# Patient Record
Sex: Female | Born: 1935 | Race: White | Hispanic: No | Smoking: Former smoker
Health system: Southern US, Community
[De-identification: ages and names within clinical notes are randomized; demographics above are authoritative.]

## PROBLEM LIST (undated history)

## (undated) DIAGNOSIS — I7090 Unspecified atherosclerosis: Secondary | ICD-10-CM

## (undated) DIAGNOSIS — F329 Major depressive disorder, single episode, unspecified: Secondary | ICD-10-CM

## (undated) DIAGNOSIS — T7840XA Allergy, unspecified, initial encounter: Secondary | ICD-10-CM

## (undated) DIAGNOSIS — I639 Cerebral infarction, unspecified: Secondary | ICD-10-CM

## (undated) DIAGNOSIS — F32A Depression, unspecified: Secondary | ICD-10-CM

## (undated) DIAGNOSIS — E7800 Pure hypercholesterolemia, unspecified: Secondary | ICD-10-CM

## (undated) DIAGNOSIS — F0391 Unspecified dementia with behavioral disturbance: Secondary | ICD-10-CM

## (undated) DIAGNOSIS — E079 Disorder of thyroid, unspecified: Secondary | ICD-10-CM

## (undated) DIAGNOSIS — H269 Unspecified cataract: Secondary | ICD-10-CM

## (undated) DIAGNOSIS — M1990 Unspecified osteoarthritis, unspecified site: Secondary | ICD-10-CM

## (undated) DIAGNOSIS — F419 Anxiety disorder, unspecified: Secondary | ICD-10-CM

## (undated) DIAGNOSIS — F03918 Unspecified dementia, unspecified severity, with other behavioral disturbance: Secondary | ICD-10-CM

## (undated) HISTORY — DX: Unspecified osteoarthritis, unspecified site: M19.90

## (undated) HISTORY — PX: ABDOMINAL HYSTERECTOMY: SHX81

## (undated) HISTORY — DX: Major depressive disorder, single episode, unspecified: F32.9

## (undated) HISTORY — DX: Disorder of thyroid, unspecified: E07.9

## (undated) HISTORY — DX: Allergy, unspecified, initial encounter: T78.40XA

## (undated) HISTORY — DX: Depression, unspecified: F32.A

## (undated) HISTORY — DX: Anxiety disorder, unspecified: F41.9

## (undated) HISTORY — DX: Unspecified cataract: H26.9

## (undated) HISTORY — PX: EYE SURGERY: SHX253

## (undated) HISTORY — PX: KNEE SURGERY: SHX244

## (undated) HISTORY — PX: JOINT REPLACEMENT: SHX530

## (undated) HISTORY — DX: Cerebral infarction, unspecified: I63.9

---

## 1998-06-10 ENCOUNTER — Observation Stay (HOSPITAL_COMMUNITY): Admission: AD | Admit: 1998-06-10 | Discharge: 1998-06-11 | Payer: Self-pay | Admitting: Cardiology

## 1998-06-19 ENCOUNTER — Emergency Department (HOSPITAL_COMMUNITY): Admission: EM | Admit: 1998-06-19 | Discharge: 1998-06-19 | Payer: Self-pay | Admitting: Emergency Medicine

## 1998-06-19 ENCOUNTER — Encounter: Payer: Self-pay | Admitting: Emergency Medicine

## 1998-08-04 ENCOUNTER — Encounter: Payer: Self-pay | Admitting: Internal Medicine

## 1998-08-04 ENCOUNTER — Ambulatory Visit (HOSPITAL_COMMUNITY): Admission: RE | Admit: 1998-08-04 | Discharge: 1998-08-04 | Payer: Self-pay | Admitting: Internal Medicine

## 1999-10-23 ENCOUNTER — Emergency Department (HOSPITAL_COMMUNITY): Admission: EM | Admit: 1999-10-23 | Discharge: 1999-10-23 | Payer: Self-pay | Admitting: Emergency Medicine

## 2002-08-09 ENCOUNTER — Encounter: Admission: RE | Admit: 2002-08-09 | Discharge: 2002-08-09 | Payer: Self-pay | Admitting: Internal Medicine

## 2002-08-09 ENCOUNTER — Encounter: Payer: Self-pay | Admitting: Internal Medicine

## 2003-02-27 ENCOUNTER — Encounter: Payer: Self-pay | Admitting: Orthopedic Surgery

## 2003-03-03 ENCOUNTER — Inpatient Hospital Stay (HOSPITAL_COMMUNITY): Admission: RE | Admit: 2003-03-03 | Discharge: 2003-03-07 | Payer: Self-pay | Admitting: Orthopedic Surgery

## 2003-11-14 ENCOUNTER — Encounter (INDEPENDENT_AMBULATORY_CARE_PROVIDER_SITE_OTHER): Payer: Self-pay | Admitting: *Deleted

## 2003-11-14 ENCOUNTER — Ambulatory Visit (HOSPITAL_COMMUNITY): Admission: RE | Admit: 2003-11-14 | Discharge: 2003-11-14 | Payer: Self-pay | Admitting: *Deleted

## 2003-12-30 ENCOUNTER — Emergency Department (HOSPITAL_COMMUNITY): Admission: EM | Admit: 2003-12-30 | Discharge: 2003-12-31 | Payer: Self-pay | Admitting: *Deleted

## 2005-02-25 ENCOUNTER — Encounter: Admission: RE | Admit: 2005-02-25 | Discharge: 2005-02-25 | Payer: Self-pay | Admitting: Orthopedic Surgery

## 2005-08-22 ENCOUNTER — Emergency Department (HOSPITAL_COMMUNITY): Admission: EM | Admit: 2005-08-22 | Discharge: 2005-08-22 | Payer: Self-pay | Admitting: Emergency Medicine

## 2007-03-05 ENCOUNTER — Other Ambulatory Visit: Admission: RE | Admit: 2007-03-05 | Discharge: 2007-03-05 | Payer: Self-pay | Admitting: Cardiology

## 2007-03-09 ENCOUNTER — Encounter: Admission: RE | Admit: 2007-03-09 | Discharge: 2007-03-09 | Payer: Self-pay | Admitting: Internal Medicine

## 2007-03-20 ENCOUNTER — Other Ambulatory Visit: Admission: RE | Admit: 2007-03-20 | Discharge: 2007-03-20 | Payer: Self-pay | Admitting: Interventional Radiology

## 2007-03-20 ENCOUNTER — Encounter (INDEPENDENT_AMBULATORY_CARE_PROVIDER_SITE_OTHER): Payer: Self-pay | Admitting: Interventional Radiology

## 2007-03-20 ENCOUNTER — Encounter: Admission: RE | Admit: 2007-03-20 | Discharge: 2007-03-20 | Payer: Self-pay | Admitting: Internal Medicine

## 2007-10-30 ENCOUNTER — Encounter: Admission: RE | Admit: 2007-10-30 | Discharge: 2007-10-30 | Payer: Self-pay | Admitting: Internal Medicine

## 2008-03-04 ENCOUNTER — Emergency Department (HOSPITAL_COMMUNITY): Admission: EM | Admit: 2008-03-04 | Discharge: 2008-03-04 | Payer: Self-pay | Admitting: Emergency Medicine

## 2008-04-03 IMAGING — US US BIOPSY
1 series · 5 of 5 positions shown · non-contrast
Comparison: none

CLINICAL DATA: Small multinodular goiter with 2.2 cm mid right lobe nodule when compared with ultrasound performed on 03/09/07.  Request has been made for fine needle aspiration.
 ULTRASOUND-GUIDED FINE NEEDLE ASPIRATION, RIGHT LOBE OF THYROID:

[Series 1: us biopsy · 5 acquisitions, 5 frames shown]
[im 1/5]
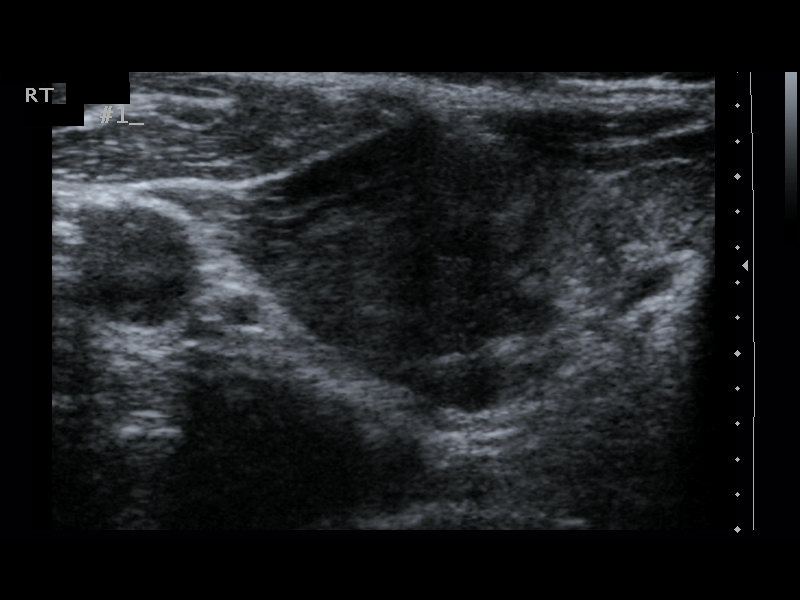
[im 2/5]
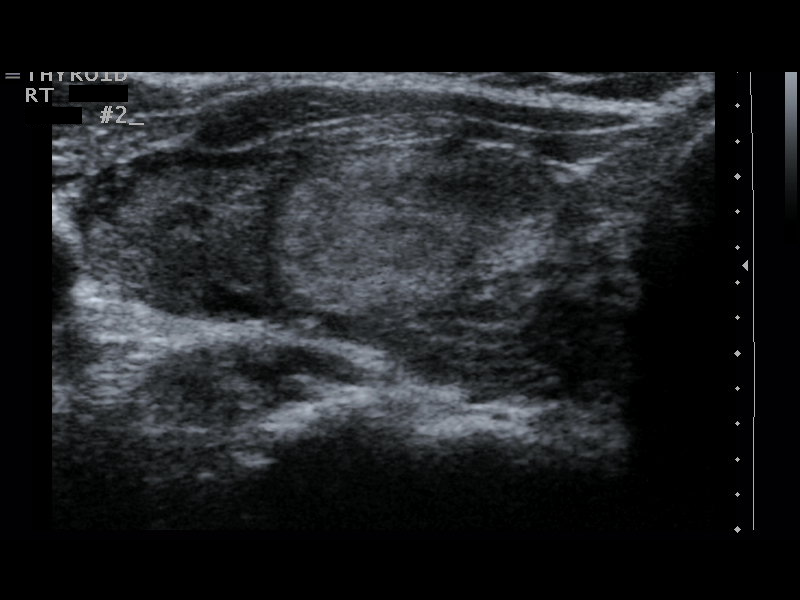
[im 3/5]
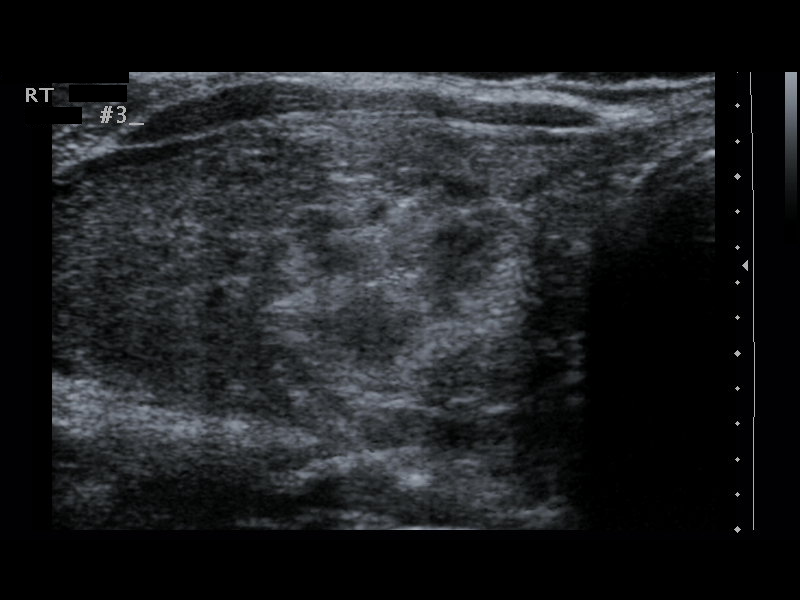
[im 4/5]
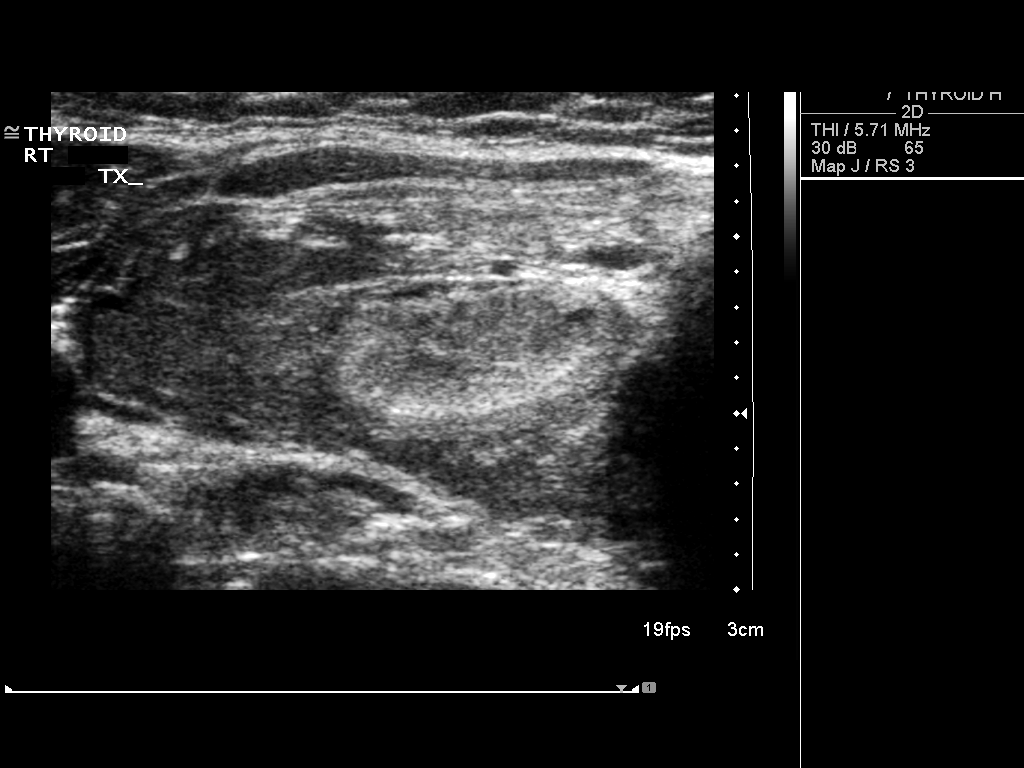
[im 5/5]
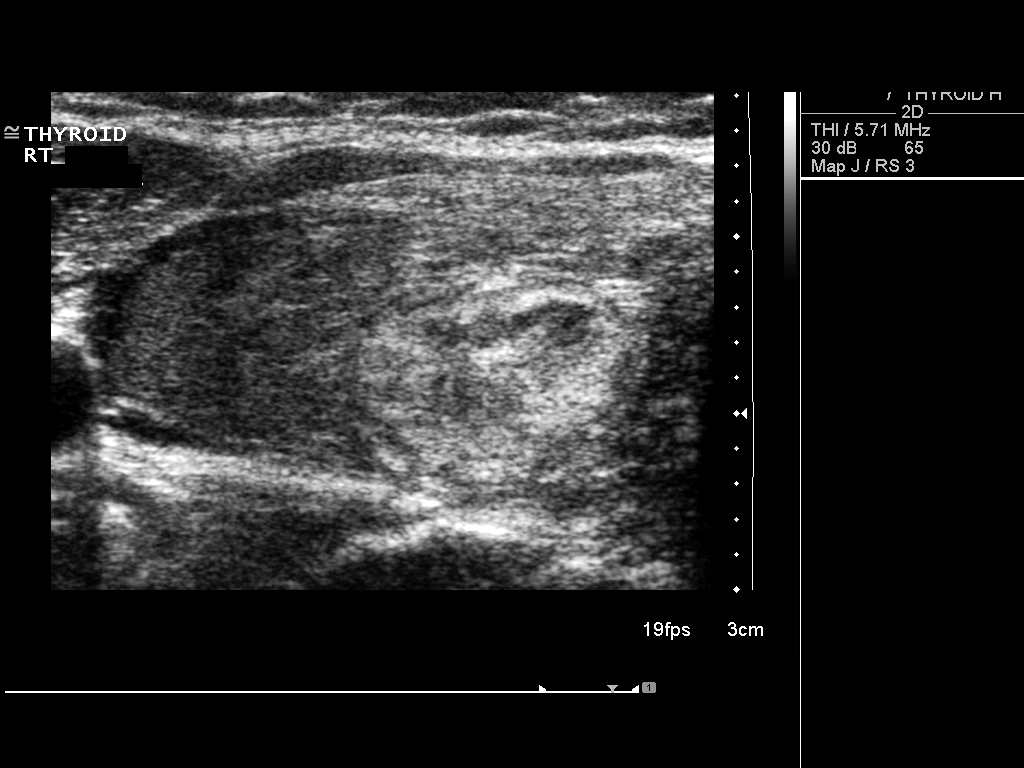

[5 of 5 positions shown; findings below may reference images not displayed]

FINDINGS: The above procedure was thoroughly discussed with the patient and written informed consent was obtained. 
 Ultrasound was then performed to localize and mark an adequate site for the biopsy.  The patient was then prepped and draped in a normal sterile fashion.  1% lidocaine was used for local anesthesia.  Using direct ultrasound guidance, three passes were made using a 25 gauge hypodermic needle into the nodule located within right mid pole of the thyroid.  Ultrasound confirmed placement of the needle on all three occasions.  The specimens were given to Pathology for further analysis.  Post procedure imaging demonstrated no hematoma or immediate complication.  The patient tolerated the procedure well.
IMPRESSION: Successful ultrasound-guided fine needle aspiration, right mid pole of the thyroid.  Final pathology pending.

## 2008-12-29 ENCOUNTER — Encounter: Admission: RE | Admit: 2008-12-29 | Discharge: 2008-12-29 | Payer: Self-pay | Admitting: Internal Medicine

## 2010-06-14 ENCOUNTER — Ambulatory Visit (HOSPITAL_COMMUNITY): Admission: RE | Admit: 2010-06-14 | Discharge: 2010-06-14 | Payer: Self-pay | Admitting: Orthopedic Surgery

## 2010-10-02 ENCOUNTER — Encounter: Payer: Self-pay | Admitting: Internal Medicine

## 2010-10-04 ENCOUNTER — Encounter: Payer: Self-pay | Admitting: Orthopedic Surgery

## 2011-01-12 ENCOUNTER — Encounter: Payer: Self-pay | Admitting: Internal Medicine

## 2011-01-12 ENCOUNTER — Encounter: Payer: Self-pay | Admitting: Neurology

## 2011-01-13 ENCOUNTER — Other Ambulatory Visit: Payer: Self-pay | Admitting: Internal Medicine

## 2011-01-13 DIAGNOSIS — E041 Nontoxic single thyroid nodule: Secondary | ICD-10-CM

## 2011-01-13 DIAGNOSIS — R1900 Intra-abdominal and pelvic swelling, mass and lump, unspecified site: Secondary | ICD-10-CM

## 2011-01-18 ENCOUNTER — Other Ambulatory Visit: Payer: Self-pay

## 2011-01-18 ENCOUNTER — Inpatient Hospital Stay: Admission: RE | Admit: 2011-01-18 | Payer: Self-pay | Source: Ambulatory Visit

## 2011-01-28 NOTE — Op Note (Signed)
NAME:  Christina Shepherd, Christina Shepherd                        ACCOUNT NO.:  [id number]   MEDICAL RECORD NO.:  [id number]                   PATIENT TYPE:  INP   LOCATION:  0002                                 FACILITY:  [facility]   PHYSICIAN:  Ollen Gross, M.D.                 DATE OF BIRTH:  10/15/1930   DATE OF PROCEDURE:  03/03/2003  DATE OF DISCHARGE:                                 OPERATIVE REPORT   PREOPERATIVE DIAGNOSIS:  Osteoarthritis of the left knee.   POSTOPERATIVE DIAGNOSIS:  Osteoarthritis of the left knee.   PROCEDURE:  Left total knee arthroplasty.   SURGEON:  Ollen Gross, M.D.   ASSISTANT:  Alexzandrew L. Julien Girt, P.A.   ANESTHESIA:  Spinal.   ESTIMATED BLOOD LOSS:  Minimal.   DRAINS:  Hemovac x1.   COMPLICATIONS:  None.   TOURNIQUET TIME:  46 minutes at 300 mmHg.   CONDITION:  Stable to recovery.   BRIEF CLINICAL NOTE:  Christina Shepherd is a 77 year old female with significant  osteoarthritis of the left knee with pain refractory to nonoperative  management including injections. She presents now for left total knee  arthroplasty after the failure of nonoperative management.   DESCRIPTION OF PROCEDURE:  After successful administration of spinal  anesthetic, the tourniquet was placed high on the left thigh and left lower  extremity prepped and draped in the usual sterile fashion. Extremities  wrapped in Esmarch, knee flexed, tourniquet inflated to 300 mmHg. A standard  midline incision is made with a 10 blade through the subcutaneous tissue to  the level of the extensor mechanism. A fresh blade is used to make a medial  parapatellar arthrotomy and the soft tissue over the proximal medial tibia  subperiosteally elevated to the joint line with a knife and into the  semimembranosus bursa with a curved osteotome. The soft tissue over the  proximal lateral tibia is elevated with attention being paid to avoiding the  patellar tendon on the tibial tubercle. The patella is  everted and knee  flexed 90 degrees and the ACL and PCL  removed. The majority of her wear was  in the medial compartment and she did have high grade chondromalacia in the  other compartments also. A drill was used to create a starting hole in the  distal femur, canal is irrigated and the 5 degree left valgus alignment  guide placed. Referencing off the posterior condyles, rotations marked and a  block pinned to remove 10 mm off the distal femur. Distal femoral resection  is made with an oscillating saw. A sizing block is placed and a size 3 is  the most appropriate. Rotations marked off  the epicondylar axis and the  size 3 cutting block placed and anterior and posterior cuts made.   The tibia is then subluxed forward and the extramedullary tibial alignment  guide placed. Referencing proximally at the medial aspect of the tibial  tubercle and  distally along the second metatarsal axis of the tibial crest,  the block is pinned to remove 10 mm off the nondeficient lateral side. I  then resected the menisci and did the tibial resection. With the cutting  through the non________ block, I was barely skimming anything medially,  thus, I went through the slot essentially taking off 12 mm off the lateral  side. As the resection was made, the proximal tibia was then prepared with a  modular drill and keel punch for a size 3. Femoral preparation is completed  with the intercondylar and chamfer cuts for a size 3. A size 3 posterior  stabilized femoral trial, size 3 mobile bearing tibial trial and a 12.5 mm  rotating platform posterior stabilized insert trial are placed. Full  extension is achieved with excellent varus and valgus balance throughout  full range of motion. The patella is everted, thickness measured to be 26  mm, free hand resection taken to 15 mm, 38 template placed, lug holes  drilled, trial patella placed and it tracks normally. The trial is then  removed with the exception of femoral  trial and then laminar spreaders  placed and there is posterior osteophytes removed with femoral trial in  place. All trials removed, cut bone surfaces prepared with pulsatile lavage  and the cement mixed. Once ready for implantation, the size 3 mobile bearing  tibial tray, size 3 posterior stabilized femur and 38 patella cemented into  place, patella is held with the clamp. A 12.5 mm trial insert is placed,  knee held in full extension, all extruded cement removed. Once the cement is  fully hardened and the permanent 12.5 mm posterior stabilized rotating  platform insert is placed in the size 3 tibial tray. The wound is copiously  irrigated with antibiotic solution. There is excellent varus and valgus  balance throughout full range of motion. Extensor mechanism is closed over a  Hemovac drain with interrupted #1 PDS and flexion against gravity is 135  degrees. The tourniquet is released with a total time of 36 minutes. The  subcu is closed with interrupted 2-0 Vicryl, subcuticular with running 4-0  Monocryl. The incision was cleaned and dried and Steri-Strips and bulky  sterile dressing applied. She was then awakened and transported to recovery  in stable condition.                                               Ollen Gross, M.D.    FA/MEDQ  D:  03/03/2003  T:  03/03/2003  Job:  [id number]

## 2011-01-28 NOTE — Op Note (Signed)
NAME:  Christina Shepherd, Christina Shepherd                        ACCOUNT NO.:  [id number]   MEDICAL RECORD NO.:  [id number]                   PATIENT TYPE:  AMB   LOCATION:  ENDO                                 FACILITY:  [facility]   PHYSICIAN:  Georgiana Spinner, M.D.                 DATE OF BIRTH:  [birth year]   DATE OF PROCEDURE:  11/14/2003  DATE OF DISCHARGE:                                 OPERATIVE REPORT   PROCEDURE:  Upper endoscopy.   INDICATIONS:  Gastroesophageal reflux disease.   ANESTHESIA:  1. Demerol 80.  2. Versed 8 mg.   DESCRIPTION OF PROCEDURE:  With patient mildly sedated in the left lateral  decubitus position, the Olympus videoscopic endoscope was inserted in the  mouth, passed under direct vision through the esophagus, which appeared  normal, into the stomach.  Fundus, body, antrum, duodenal bulb, second  portion of duodenum all appeared normal.  From this point, the endoscope was  slowly withdrawn, taking circumferential views of duodenal mucosa until the  endoscope then pulled back into the stomach, placed in retroflexion to view  the stomach from below.  The endoscope was then straightened and withdrawn,  taking circumferential views of the remaining gastric and esophageal mucosa,  stopping at 20 cm from the incisors at which point appeared that there was  an esophageal polyp that we grasped with the biopsy forceps but apparently  was a foreign body because it came off without being attached to the  esophageal mucosa, and we elected to send it to pathology.  The patient's  vital signs and pulse oximeter remained stable.  The patient tolerated the  procedure well without apparent complications.   FINDINGS:  1. Unremarkable examination.  2. Question of foreign body sent for pathology.  Await biopsy report.  The     patient will call me for results and follow up with me as an outpatient.  3. Proceed to colonoscopy.                                               Georgiana Spinner,  M.D.    GMO/MEDQ  D:  11/14/2003  T:  11/14/2003  Job:  [id number]

## 2011-01-28 NOTE — Discharge Summary (Signed)
NAME:  Christina Shepherd, Christina Shepherd                        ACCOUNT NO.:  [id number]   MEDICAL RECORD NO.:  [id number]                   PATIENT TYPE:  INP   LOCATION:  0455                                 FACILITY:  [facility]   PHYSICIAN:  Ollen Gross, M.D.                 DATE OF BIRTH:  [birth year]   DATE OF ADMISSION:  03/03/2003  DATE OF DISCHARGE:  03/07/2003                                 DISCHARGE SUMMARY   ADMITTING DIAGNOSES:  1. Osteoarthritis left knee.  2. Hypertension.  3. Hypothyroidism.  4. Hypercholesterolemia.  5. Depression.  6. Mild urinary incontinence.  7. Heart disease with coronary arterial disease with stenting.   DISCHARGE DIAGNOSES:  1. Osteoarthritis left knee status post left total knee replacement     arthroplasty.  2. Mild postoperative blood loss anemia (did not require transfusion).  3. Mild postoperative hypokalemia, improved.  4. Mild postoperative hyponatremia, improved.  5. Mild postoperative confusion, resolved.   PROCEDURE:  The patient was taken to the OR on March 03, 2003.  Underwent a  left total knee arthroplasty.  Surgeon Ollen Gross, M.D.  Assistant  Alexzandrew L. Julien Girt, P.A.  Surgery under spinal anesthesia with minimal  blood loss.  Hemovac drain x1.  Tourniquet time 46 minutes at 300 mmHg.   CONSULTS:  None.   BRIEF HISTORY:  The patient is a 77 year old female with a two to three year  history of progressive worsening left knee pain.  It started to interfere  with her activities and also having pain at night.  She has had multiple  injections by Lemmie Evens, M.D. which have included Hyalgan.  Seen by  Ollen Gross, M.D.  Felt she had reached a point where she could benefit  from undergoing total knee replacement.  Risks and benefits discussed.  The  patient subsequently admitted to the hospital.   LABORATORY DATA:  CBC on admission showed hemoglobin 12.6, hematocrit 36.4,  white cell count 7.3, red cell count 4.12.   Differential all within normal  limits.  Postoperative H&H 10.0 and 29.2.  Last noted H&H 9.0 and 25.9.  PT/PTT on admission were 12.5 and 33, respectively with an INR of 0.9.  Serial pro times followed.  Last noted PT/INR 24.0 and 2.5.  Chem panel on  admission showed slightly elevated BUN of 25.  Remaining chem panel all  within normal limits.  Serial BMETs were followed.  BUN came back down to  within normal limits of 6.  Sodium dropped from 141 down to 130 back up to  134.  Potassium dropped from 3.7 down to 3.0 back up to 3.7.  Calcium  dropped from 9.0 to 8.3 back up to 8.5.  Urinalysis only showed small blood,  small leukocyte esterase with few epithelial cells, 0-2 white cells, 0-2 red  cells, and few bacteria.  Blood group type B+.   EKG dated February 27, 2003:  Normal  sinus rhythm.  Cannot rule out anterior  infarct age undetermined.  Abnormal EKG.  Low voltage.  Confirmed by Dani Gobble, MD  Chest x-ray dated February 27, 2003:  Bibasilar scarring,  atelectasis.  No active disease.   HOSPITAL COURSE:  The patient was admitted to [facility].  Taken  to the operating room.  Underwent the above stated procedure without  complications.  The patient tolerated procedure well and was later returned  to recovery room and then to the orthopedic floor for continued  postoperative care.  The patient was given 24 hours of postoperative IV  antibiotics, placed on PC analgesia for pain control following surgery, and  supplemented by p.o. medications.  PT and OT were consulted to assist with  gait training, ambulation, and ADLs.  The patient was also placed on  Coumadin for DVT prophylaxis.  Hemovac drain placed at time of surgery was  pulled on postoperative day one.  Began postoperative PT and OT for therapy.  By day two patient was doing a little bit better on her pain control.  The  patient was weaned off her PCA over to p.o. medications.  She was noted to  have a drop in sodium  with drop in potassium.  The patient was placed on  potassium supplements.  IVs were discontinued.  Fluids were discontinued.  By the following day her sodium and potassium had improved from 130 and 3.0  up to 134 and 3.7.  She was noted to have just a little bit of bleeding from  the incision on postoperative day two.  When the dressing was changed there  was no signs of infection.  By day three the bleeding had stopped.  No signs  of infection.  She was already bending up to 90 degrees by day three which  indicated she was doing extremely well with therapy.  She also did well with  her mobility.  She was up ambulating approximately 10 feet by postoperative  day two and later 200 feet by that afternoon.  She was going approximately  250 feet by day three and also day four.  She did very well with her  physical therapy.  Unfortunately, on the early morning of postoperative day  four patient had become confused and quite agitated.  She was somewhat  distressed and wanting to leave the hospital early that morning.  She was  given Ativan.  She was upset with the staff and became agitated and told  them I want to go home.  Medications were called in by the covering  orthopedic staff.  She was found to be crying and quite agitated.  She was  able to be calmed down until later that morning when Ollen Gross, M.D.  came in on the morning of postoperative day four on March 07, 2003.  She had  admitted that she got a little bit of confused and became scared, agitated  earlier that morning.  She was actually somewhat embarrassed by her actions  and did apologize.  Ollen Gross, M.D. talked with her at length that  morning.  The patient was alert and oriented to person, time, and place.  She did want to go home.  Once it was determined that the patient was  oriented and that she was getting up safely, she had been already weaned over to p.o. medications.  She was getting around, ambulating  approximately  250 feet.  It was decided that the patient would be discharged home  to her  own home environment.  With respects to her knee she was doing extremely  well.   DISCHARGE PLAN:  The patient discharged home on March 07, 2003.   DISCHARGE DIAGNOSES:  Please see above.   DISCHARGE MEDICATIONS:  1. Vicodin for pain.  2. Robaxin for spasm.  3. Coumadin as per pharmacy protocol.  4. She will continue her home medications with the exception of aspirin.   DIET:  Low sodium, low cholesterol diet.   ACTIVITY:  She is full weightbearing to left lower extremity.  Home health  PT, home health nursing through [facility].  Continue with gait  training, ambulation, ADLs.  Total knee protocol.   FOLLOWUP:  Follow up two weeks from surgery.  Call the office for  appointment.    DISPOSITION:  Home.   CONDITION ON DISCHARGE:  Improved.     Alexzandrew L. Julien Girt, P.A.              Ollen Gross, M.D.    ALP/MEDQ  D:  03/31/2003  T:  03/31/2003  Job:  [id number]   cc:   Soyla Murphy. Renne Crigler, M.D.  [address]  [place]  [place] [address]  Fax: [phone]

## 2011-01-28 NOTE — H&P (Signed)
NAME:  Christina Shepherd, Christina Shepherd NO.:  [id number]   MEDICAL RECORD NO.:  [id number]                   PATIENT TYPE:   LOCATION:                                       FACILITY:   PHYSICIAN:  Ollen Gross, M.D.                 DATE OF BIRTH:   DATE OF ADMISSION:  03/03/2003  DATE OF DISCHARGE:                                HISTORY & PHYSICAL   CHIEF COMPLAINT:  Left knee pain.   HISTORY OF PRESENT ILLNESS:  The patient is a 77 year old female with a two  to three year history of progressively worsening pain in her left knee.  She  states that now it is hurting her all the time, and she even has pain at  night.  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  She is at a  stage where she feels it needs to be replaced.  She has had multiple  injections from Dr. Jimmy Footman, which have included Hyalgan.  Dr. Lequita Halt  feels that it is best at this time to proceed with a total knee replacement  and the patient agrees.  The risks and benefits of the surgery have been  discussed with the patient and the patient wishes to proceed.   PAST MEDICAL HISTORY:  1. Hypertension.  2. Hypothyroidism.  3. Hypercholesterolemia.  4. Depression.  5. Some slight urinary incontinence.   PAST SURGICAL HISTORY:  1. Right total knee arthroplasty.  2. Hysterectomy.  3. Stent placement in the heart.   MEDICATIONS:  1. Lipitor 40 mg one p.o. daily.  2. Synthroid, dosage not known.  3. Mobic, dosage not known.  4. Vicodin one or two p.o. q.4-6h. p.r.n. pain.  5. Ditropan XL 10 mg one p.o. daily.  6. Prozac 20 mg one p.o. daily.  7. Vitamins which she has stopped taking.  8. Aspirin, also which she has stopped taking.  9. Ambien 5 to 10 mg p.o. q.h.s.   ALLERGIES:  PENICILLIN causes hives and throat swelling.   SOCIAL HISTORY:  The patient denies any tobacco or alcohol use.  She lives  in a one story house with four steps.  She is married.  Her daughter will be  her  caregiver after surgery, and the patient requests a private room after  surgery due to a phobia of having someone in the room with her.   FAMILY HISTORY:  Father deceased, ruptured aorta.  Brother had coronary  artery disease.   REVIEW OF SYSTEMS:  GENERAL:  Denies fevers, chills, night sweats, or  bleeding tendencies.  CNS:  Denies blurry or double vision, seizures,  headaches, or paralysis.  RESPIRATORY:  Denies shortness of breath,  productive cough, hemoptysis.  CARDIOVASCULAR:  Denies chest pain, angina,  orthopnea.  GASTROINTESTINAL:  Denies nausea, vomiting, diarrhea,  constipation, melena, bloody stools.  GENITOURINARY:  Denies dysuria,  hematuria, discharge.  MUSCULOSKELETAL:  Pertinent  in the HPI.   PHYSICAL EXAMINATION:  VITAL SIGNS:  Blood pressure 120/70, pulse 60,  respirations 12.  GENERAL:  A well-developed, well-nourished 77 year old female.  HEENT:  Normocephalic, atraumatic.  Pupils equal, round, reactive to light.  NECK:  Supple, no carotid bruit noted.  CHEST:  Clear to auscultation bilaterally.  No wheezes or crackles.  HEART:  Regular rate and rhythm without any murmurs, rubs, or gallops.  ABDOMEN:  Soft, nontender, nondistended, positive bowel sounds x4.  EXTREMITIES:  The patient has a varus deformity with no effusion of the left  knee.  Range of motion is from 5 to 120 degrees.  There is no instability.  There is tenderness throughout the medial aspect of the joint.  She has  moderate to significant crepitus.  SKIN:  No rashes or lesions.   LABORATORY DATA:  X-ray reveals bone-on-bone changes of the medial and  patellofemoral compartments.   IMPRESSION:  1. Osteoarthritis of the left knee.  2. Hypertension.  3. Hypothyroidism.  4. Hypercholesterolemia.   PLAN:  The patient will be admitted to [facility] on March 03, 2003, and undergo a left total knee arthroplasty by Dr. Ollen Gross.      Clarene Reamer, P.A.-C.                   Ollen Gross, M.D.    SW/MEDQ  D:  02/27/2003  T:  02/27/2003  Job:  [id number]

## 2011-01-28 NOTE — Op Note (Signed)
NAME:  Christina Shepherd, Christina Shepherd                        ACCOUNT NO.:  [id number]   MEDICAL RECORD NO.:  [id number]                   PATIENT TYPE:  AMB   LOCATION:  ENDO                                 FACILITY:  [facility]   PHYSICIAN:  Georgiana Spinner, M.D.                 DATE OF BIRTH:  25-Jan-1931   DATE OF PROCEDURE:  11/14/2003  DATE OF DISCHARGE:                                 OPERATIVE REPORT   PROCEDURE:  Colonoscopy.   INDICATIONS:  Colon cancer screening.   ANESTHESIA:  1. Demerol 20 mg.  2. Versed 2 mg.   DESCRIPTION OF PROCEDURE:  With patient mildly sedated in the left lateral  decubitus position, the Olympus videoscopic colonoscope was inserted in the  rectum and passed under direct vision to the cecum, identified by ileocecal  valve and appendiceal orifice, both of which were photographed.  From this  point, the colonoscope was slowly withdrawn, taking circumferential views of  colonic mucosa, stopping only in the rectum which appeared normal on direct  and showed hemorrhoids on retroflexed view.  The endoscope was straightened  and withdrawn.  The patient's vital signs and pulse oximeter remained  stable.  The patient tolerated the procedure well without apparent  complications.   FINDINGS:  Slightly suboptimal prep, in that there were areas of solid  stool.  But otherwise, an unremarkable examination other than internal  hemorrhoids.   PLAN:  See endoscopy note for further details.                                               Georgiana Spinner, M.D.    GMO/MEDQ  D:  11/14/2003  T:  11/14/2003  Job:  [id number]

## 2011-06-09 LAB — POCT I-STAT, CHEM 8
BUN: 22
Chloride: 104
Creatinine, Ser: 0.9
Glucose, Bld: 92
HCT: 41
Potassium: 3.5
Sodium: 140

## 2011-06-09 LAB — COMPREHENSIVE METABOLIC PANEL
AST: 37
Albumin: 4.2
BUN: 20
CO2: 28
Creatinine, Ser: 0.77
GFR calc Af Amer: >60
GFR calc non Af Amer: >60
Total Protein: 6.4

## 2011-06-09 LAB — POCT CARDIAC MARKERS
CKMB, poc: 2.5
CKMB, poc: 4.8
Myoglobin, poc: 103
Operator id: 272551
Operator id: 272551
Troponin i, poc: <0.05

## 2011-06-09 LAB — DIFFERENTIAL
Basophils Relative: 1
Eosinophils Relative: 3
Monocytes Absolute: 0.6
Monocytes Relative: 7
Neutrophils Relative %: 58

## 2011-06-09 LAB — CBC
Hemoglobin: 14.2
MCHC: 34.8
Platelets: 233
RDW: 13.8
WBC: 9.3

## 2011-08-01 ENCOUNTER — Other Ambulatory Visit: Payer: Self-pay

## 2011-08-01 ENCOUNTER — Emergency Department (HOSPITAL_COMMUNITY): Payer: Medicare Other

## 2011-08-01 ENCOUNTER — Emergency Department (HOSPITAL_COMMUNITY)
Admission: EM | Admit: 2011-08-01 | Discharge: 2011-08-01 | Discharge status: Against Medical Advice | Payer: Medicare Other | Attending: Emergency Medicine | Admitting: Emergency Medicine

## 2011-08-01 ENCOUNTER — Encounter: Payer: Self-pay | Admitting: Emergency Medicine

## 2011-08-01 DIAGNOSIS — R4789 Other speech disturbances: Secondary | ICD-10-CM | POA: Insufficient documentation

## 2011-08-01 DIAGNOSIS — R5381 Other malaise: Secondary | ICD-10-CM | POA: Insufficient documentation

## 2011-08-01 DIAGNOSIS — R42 Dizziness and giddiness: Secondary | ICD-10-CM

## 2011-08-01 DIAGNOSIS — R5383 Other fatigue: Secondary | ICD-10-CM | POA: Insufficient documentation

## 2011-08-01 DIAGNOSIS — E7800 Pure hypercholesterolemia, unspecified: Secondary | ICD-10-CM | POA: Insufficient documentation

## 2011-08-01 DIAGNOSIS — R259 Unspecified abnormal involuntary movements: Secondary | ICD-10-CM | POA: Insufficient documentation

## 2011-08-01 HISTORY — DX: Pure hypercholesterolemia, unspecified: E78.00

## 2011-08-01 LAB — CBC
HCT: 33.7 % — ABNORMAL LOW (ref 36.0–46.0)
Hemoglobin: 11.3 g/dL — ABNORMAL LOW (ref 12.0–15.0)
MCH: 29.3 pg (ref 26.0–34.0)
MCHC: 33.5 g/dL (ref 30.0–36.0)
MCV: 87.3 fL (ref 78.0–100.0)
Platelets: 232 10*3/uL (ref 150–400)
RBC: 3.86 MIL/uL — ABNORMAL LOW (ref 3.87–5.11)
RDW: 15.1 % (ref 11.5–15.5)
WBC: 9.3 10*3/uL (ref 4.0–10.5)

## 2011-08-01 LAB — CARDIAC PANEL(CRET KIN+CKTOT+MB+TROPI)
CK, MB: 3.7 ng/mL (ref 0.3–4.0)
Relative Index: 2 (ref 0.0–2.5)
Total CK: 183 U/L — ABNORMAL HIGH (ref 7–177)
Troponin I: <0.3 ng/mL (ref <0.30)

## 2011-08-01 LAB — URINALYSIS, ROUTINE W REFLEX MICROSCOPIC
Bilirubin Urine: NEGATIVE
Glucose, UA: NEGATIVE mg/dL
Hgb urine dipstick: NEGATIVE
Ketones, ur: NEGATIVE mg/dL
Nitrite: NEGATIVE
Protein, ur: NEGATIVE mg/dL
Specific Gravity, Urine: 1.018 (ref 1.005–1.030)
Urobilinogen, UA: 1 mg/dL (ref 0.0–1.0)
pH: 6 (ref 5.0–8.0)

## 2011-08-01 LAB — DIFFERENTIAL
Basophils Absolute: 0 10*3/uL (ref 0.0–0.1)
Basophils Relative: 0 % (ref 0–1)
Eosinophils Absolute: 0.4 10*3/uL (ref 0.0–0.7)
Eosinophils Relative: 4 % (ref 0–5)
Lymphocytes Relative: 16 % (ref 12–46)
Lymphs Abs: 1.5 10*3/uL (ref 0.7–4.0)
Monocytes Absolute: 1 10*3/uL (ref 0.1–1.0)
Monocytes Relative: 10 % (ref 3–12)
Neutro Abs: 6.5 10*3/uL (ref 1.7–7.7)
Neutrophils Relative %: 70 % (ref 43–77)

## 2011-08-01 LAB — BASIC METABOLIC PANEL
BUN: 32 mg/dL — ABNORMAL HIGH (ref 6–23)
CO2: 26 mEq/L (ref 19–32)
Calcium: 9.6 mg/dL (ref 8.4–10.5)
Chloride: 98 mEq/L (ref 96–112)
Creatinine, Ser: 1.2 mg/dL — ABNORMAL HIGH (ref 0.50–1.10)
GFR calc Af Amer: 49 mL/min — ABNORMAL LOW (ref >90)
GFR calc non Af Amer: 42 mL/min — ABNORMAL LOW (ref >90)
Glucose, Bld: 106 mg/dL — ABNORMAL HIGH (ref 70–99)
Potassium: 3.2 mEq/L — ABNORMAL LOW (ref 3.5–5.1)
Sodium: 134 mEq/L — ABNORMAL LOW (ref 135–145)

## 2011-08-01 LAB — URINE MICROSCOPIC-ADD ON

## 2011-08-01 MED ORDER — MECLIZINE HCL 25 MG PO TABS
25.0000 mg | ORAL_TABLET | Freq: Once | ORAL | Status: AC
  Administered 2011-08-01: 25 mg via ORAL
  Filled 2011-08-01: qty 1

## 2011-08-01 NOTE — ED Provider Notes (Signed)
History     CSN: [id number] Arrival date & time: 08/01/2011  3:03 PM   First MD Initiated Contact with Patient 08/01/11 1508      Chief Complaint  Patient presents with  . Dizziness    (Consider location/radiation/quality/duration/timing/severity/associated sxs/prior treatment) HPI Patient states that over the last, day she has been feeling dizzy and lightheaded, along with tremor and she states that she fell today due to the lightheadedness without striking her head but could not get up.  He denies chest pain, shortness of breath, vomiting, nausea, diarrhea, abdominal pain, or urinary symptoms.  This is also having trouble getting some of her thoughts together.  Past Medical History  Diagnosis Date  . Elevated cholesterol     Past Surgical History  Procedure Date  . Abdominal hysterectomy   . Knee surgery     No family history on file.  History  Substance Use Topics  . Smoking status: Not on file  . Smokeless tobacco: Not on file  . Alcohol Use: 0.6 oz/week    1 Glasses of wine per week    OB History    Grav Para Term Preterm Abortions TAB SAB Ect Mult Living                  Review of Systems  Constitutional: Negative for chills, diaphoresis and fatigue.  HENT: Negative for congestion, facial swelling, rhinorrhea, neck pain and neck stiffness.   Eyes: Negative for visual disturbance.  Respiratory: Negative for chest tightness and shortness of breath.   Cardiovascular: Negative for chest pain and palpitations.  Gastrointestinal: Negative for abdominal pain.  Skin: Negative for rash.  Neurological: Positive for dizziness, speech difficulty and light-headedness. Negative for weakness and headaches.  Psychiatric/Behavioral: Negative for behavioral problems and confusion.    Allergies  Review of patient's allergies indicates no known allergies.  Home Medications   Current Outpatient Rx  Name Route Sig Dispense Refill  . BUPROPION HCL ER (XL) 300 MG PO TB24  Oral Take 300 mg by mouth daily.      Marland Kitchen FLUOXETINE HCL 20 MG PO TABS Oral Take 20 mg by mouth daily.      . FUROSEMIDE 40 MG PO TABS Oral Take 40 mg by mouth daily as needed. For swelling     . LEVOTHYROXINE SODIUM 112 MCG PO TABS Oral Take 112 mcg by mouth daily.      Carma Leaven M PLUS PO TABS Oral Take 1 tablet by mouth daily.      Idolina Primer PRESERVISION PO Oral Take 1 capsule by mouth 2 (two) times daily.      Marland Kitchen NAPROXEN SODIUM 220 MG PO TABS Oral Take 440 mg by mouth daily as needed. For pain     . ROSUVASTATIN CALCIUM 20 MG PO TABS Oral Take 20 mg by mouth daily.      Marland Kitchen SPIRONOLACTONE 25 MG PO TABS Oral Take 25 mg by mouth 2 (two) times daily as needed. For leg edema     . TRAMADOL HCL 50 MG PO TABS Oral Take 50 mg by mouth 2 (two) times daily as needed. For pain. Maximum dose= 8 tablets per day     . ZOLPIDEM TARTRATE 5 MG PO TABS Oral Take 5 mg by mouth at bedtime.        BP 106/71  Pulse 86  Temp(Src) 98.5 F (36.9 C) (Oral)  Resp 18  Wt 130 lb (58.968 kg)  SpO2 97%  Physical Exam  Constitutional: She is  oriented to person, place, and time. She appears well-developed and well-nourished.  HENT:  Head: Normocephalic and atraumatic.  Eyes: Pupils are equal, round, and reactive to light.  Neck: Normal range of motion. Neck supple.  Cardiovascular: Normal rate and regular rhythm.   Pulmonary/Chest: Effort normal and breath sounds normal. No respiratory distress. She has no wheezes. She has no rales. She exhibits no tenderness.  Neurological: She is alert and oriented to person, place, and time.       Patient is having discoordination with finger-nose testing bilaterally.  She also says, her right hand feels weak when squeezing my fingers.  She is able to tell me where she is has problems with the year.   Skin: Skin is warm and dry. No rash noted. She is not diaphoretic.    ED Course  Procedures (including critical care time)  Labs Reviewed  BASIC METABOLIC PANEL - Abnormal;  Notable for the following:    Sodium 134 (*)    Potassium 3.2 (*)    Glucose, Bld 106 (*)    BUN 32 (*)    Creatinine, Ser 1.20 (*)    GFR calc non Af Amer 42 (*)    GFR calc Af Amer 49 (*)    All other components within normal limits  CBC - Abnormal; Notable for the following:    RBC 3.86 (*)    Hemoglobin 11.3 (*)    HCT 33.7 (*)    All other components within normal limits  DIFFERENTIAL  URINALYSIS, ROUTINE W REFLEX MICROSCOPIC  CARDIAC PANEL(CRET KIN+CKTOT+MB+TROPI)   Mr Brain Wo Contrast  08/01/2011  *RADIOLOGY REPORT*  Clinical Data: Passed out.  Dizziness.  Disequilibrium. Generalized weakness.  MRI HEAD WITHOUT CONTRAST  Technique:  Multiplanar, multiecho pulse sequences of the brain and surrounding structures were obtained according to standard protocol without intravenous contrast.  Comparison: 03/04/2008 CT.  No comparison MR.  Findings: Artifact extends through the pons.  No discrete acute infarct noted.  Prominent small vessel disease type changes.  No intracranial hemorrhage.  No intracranial mass lesion detected on this unenhanced exam.  Global atrophy.  Ventricular prominence felt to be related to atrophy rather than hydrocephalus.  Major intracranial vascular structures are patent.  Complete opacification of the right frontal sinus, anterior right ethmoid sinus air cells and right sphenoid sinus.  Obstructing lesion not excluded.  Mild mucosal thickening left maxillary sinus with minimal polypoid opacification medially.  Degenerative changes right aspect C1-2 articulation with joint fluid.  Mild transverse ligament hypertrophy.  Right C2-3 facet joint degenerative changes and left C3-4/C4-5 facet joint degenerative changes.  IMPRESSION: No acute infarct.  Prominent small vessel disease type changes.  Atrophy.  Paranasal sinus opacification as detailed above.  Original Report Authenticated By: Fuller Canada, M.D.      The patient is adamant about leaving.  She does not know  where she is located and she will tell me her address her birthday.  She is advised that she will have to leave against advice and will need to follow up with her doctor.  Told to return here as needed.    MDM          Carlyle Dolly, PA 08/01/11 1950

## 2011-08-01 NOTE — ED Notes (Signed)
Patient transported to CT 

## 2011-08-01 NOTE — ED Notes (Signed)
Felt dizzy and jittery early. Larey Seat in garden some shakiness. A&o x4 with EMS. Laughing and talking with EMS and staff upon arrival

## 2011-08-01 NOTE — ED Notes (Signed)
VHQ:[id number]<EX> Expected date:08/01/11<BR> Expected time: 2:37 PM<BR> Means of arrival:Ambulance<BR> Comments:<BR> Dizziness/elderly

## 2011-08-01 NOTE — ED Notes (Signed)
Patient transported to MRI 

## 2011-08-02 NOTE — ED Provider Notes (Signed)
Medical screening examination/treatment/procedure(s) were conducted as a shared visit with non-physician practitioner(s) and myself.  I personally evaluated the patient during the encounter  The pt is still unsteady on her feet.  The plan was to admit the patient given her potential fall risk.  The patient was adamant that she wanted to be discharged home to take care of her cats.  Patient was given a prescription for her she was instructed to return to the ER.. she does state she was able to ambulate back and forth to the bathroom.  Without difficulty  Lyanne Co, MD 08/02/11 [phone]

## 2011-10-25 ENCOUNTER — Other Ambulatory Visit: Payer: Self-pay | Admitting: Internal Medicine

## 2011-11-08 ENCOUNTER — Encounter: Payer: BC Managed Care – PPO | Admitting: Rehabilitative and Restorative Service Providers"

## 2011-11-10 ENCOUNTER — Other Ambulatory Visit: Payer: BC Managed Care – PPO

## 2012-04-27 ENCOUNTER — Encounter (HOSPITAL_COMMUNITY): Payer: Self-pay | Admitting: *Deleted

## 2012-04-27 ENCOUNTER — Emergency Department (HOSPITAL_COMMUNITY)
Admission: EM | Admit: 2012-04-27 | Discharge: 2012-04-27 | Disposition: A | Discharge status: Home / Self Care | Payer: Medicare Other | Source: Home / Self Care | Attending: Emergency Medicine | Admitting: Emergency Medicine

## 2012-04-27 ENCOUNTER — Emergency Department (HOSPITAL_COMMUNITY): Payer: Medicare Other

## 2012-04-27 DIAGNOSIS — Z79899 Other long term (current) drug therapy: Secondary | ICD-10-CM | POA: Insufficient documentation

## 2012-04-27 DIAGNOSIS — R51 Headache: Secondary | ICD-10-CM | POA: Insufficient documentation

## 2012-04-27 DIAGNOSIS — J329 Chronic sinusitis, unspecified: Secondary | ICD-10-CM

## 2012-04-27 LAB — BASIC METABOLIC PANEL
BUN: 15 mg/dL (ref 6–23)
Calcium: 9.4 mg/dL (ref 8.4–10.5)
Creatinine, Ser: 0.72 mg/dL (ref 0.50–1.10)
GFR calc Af Amer: >90 mL/min (ref >90)
GFR calc non Af Amer: 80 mL/min — ABNORMAL LOW (ref >90)

## 2012-04-27 LAB — CBC WITH DIFFERENTIAL/PLATELET
Basophils Absolute: 0 10*3/uL (ref 0.0–0.1)
Basophils Relative: 0 % (ref 0–1)
Eosinophils Absolute: 0 10*3/uL (ref 0.0–0.7)
Eosinophils Relative: 0 % (ref 0–5)
HCT: 37.1 % (ref 36.0–46.0)
MCH: 29.6 pg (ref 26.0–34.0)
MCHC: 34 g/dL (ref 30.0–36.0)
Monocytes Absolute: 1.1 10*3/uL — ABNORMAL HIGH (ref 0.1–1.0)
Monocytes Relative: 10 % (ref 3–12)
Neutro Abs: 8.3 10*3/uL — ABNORMAL HIGH (ref 1.7–7.7)
RDW: 13.9 % (ref 11.5–15.5)

## 2012-04-27 MED ORDER — METOCLOPRAMIDE HCL 5 MG/ML IJ SOLN
5.0000 mg | Freq: Once | INTRAMUSCULAR | Status: AC
  Administered 2012-04-27: 5 mg via INTRAVENOUS
  Filled 2012-04-27: qty 2

## 2012-04-27 MED ORDER — SODIUM CHLORIDE 0.9 % IV BOLUS (SEPSIS)
1000.0000 mL | Freq: Once | INTRAVENOUS | Status: AC
  Administered 2012-04-27: 1000 mL via INTRAVENOUS

## 2012-04-27 MED ORDER — MOXIFLOXACIN HCL 400 MG PO TABS
400.0000 mg | ORAL_TABLET | Freq: Once | ORAL | Status: AC
  Administered 2012-04-27: 400 mg via ORAL
  Filled 2012-04-27: qty 1

## 2012-04-27 MED ORDER — MOXIFLOXACIN HCL 400 MG PO TABS: 400.0000 mg | ORAL_TABLET | Freq: Every day | ORAL | Status: DC

## 2012-04-27 MED ORDER — DIPHENHYDRAMINE HCL 50 MG/ML IJ SOLN
12.5000 mg | Freq: Once | INTRAMUSCULAR | Status: AC
  Administered 2012-04-27: 12.5 mg via INTRAVENOUS
  Filled 2012-04-27: qty 1

## 2012-04-27 NOTE — ED Notes (Addendum)
Pt reports sinus pressure and headache x couple of months. Has not been given antibiotics. Sent by Dr. Norma Fredrickson- hx of stroke.

## 2012-04-28 NOTE — ED Provider Notes (Signed)
History     CSN: [id number]  Arrival date & time 04/27/12  8295   First MD Initiated Contact with Patient 04/27/12 [phone]      Chief Complaint  Patient presents with  . Headache  . Sinus Problem    (Consider location/radiation/quality/duration/timing/severity/associated sxs/prior treatment) HPI Patient is a 78 yo female who presents today for evaluation of an 8/10 bilateral headache focused over the forehead and behind the eyes with radiation down the back of the left side of the neck that began one month ago.  The headache is worse with bending over .  Patient endorses associated nausea but denies fevers or vomiting.  She denies sick contacts but has noticed associated sinus congestion.  Patient sent today by her PCP, Dr. Renne Crigler, because he was concerned that there might "brain bleeding" per the patient.  She does not take ASA, plavix, or coumadin , and denies any recent head injuries.  Patient feels she has a sinusitis and has tried a neti-pot per her PCP's suggestion without relief.  There are no other associated or modifying factors.  Past Medical History  Diagnosis Date  . Elevated cholesterol     Past Surgical History  Procedure Date  . Abdominal hysterectomy   . Knee surgery     History reviewed. No pertinent family history.  History  Substance Use Topics  . Smoking status: Not on file  . Smokeless tobacco: Not on file  . Alcohol Use: 0.6 oz/week    1 Glasses of wine per week    OB History    Grav Para Term Preterm Abortions TAB SAB Ect Mult Living                  Review of Systems  Constitutional: Negative.   HENT: Positive for congestion.   Eyes: Negative.   Respiratory: Negative.   Cardiovascular: Negative.   Gastrointestinal: Positive for nausea.  Genitourinary: Negative.   Musculoskeletal: Negative.   Skin: Negative.   Neurological: Positive for headaches.  Hematological: Negative.   Psychiatric/Behavioral: Negative.   All other systems reviewed  and are negative.    Allergies  Review of patient's allergies indicates no known allergies.  Home Medications   Current Outpatient Rx  Name Route Sig Dispense Refill  . BUPROPION HCL ER (XL) 300 MG PO TB24 Oral Take 300 mg by mouth daily.      Marland Kitchen FLUOXETINE HCL 20 MG PO TABS Oral Take 20 mg by mouth daily.      . FUROSEMIDE 40 MG PO TABS Oral Take 40 mg by mouth daily as needed. For swelling     . LEVOTHYROXINE SODIUM 112 MCG PO TABS Oral Take 112 mcg by mouth daily.      Carma Leaven M PLUS PO TABS Oral Take 1 tablet by mouth daily.      Idolina Primer PRESERVISION PO Oral Take 1 capsule by mouth 2 (two) times daily.      Marland Kitchen NAPROXEN SODIUM 220 MG PO TABS Oral Take 440 mg by mouth daily as needed. For pain     . ROSUVASTATIN CALCIUM 20 MG PO TABS Oral Take 20 mg by mouth daily.      Marland Kitchen SPIRONOLACTONE 25 MG PO TABS Oral Take 25 mg by mouth 2 (two) times daily as needed. For leg edema     . TRAMADOL HCL 50 MG PO TABS Oral Take 50 mg by mouth 2 (two) times daily as needed. For pain. Maximum dose= 8 tablets per day     .  ZOLPIDEM TARTRATE 5 MG PO TABS Oral Take 5 mg by mouth at bedtime.      Marland Kitchen MOXIFLOXACIN HCL 400 MG PO TABS Oral Take 1 tablet (400 mg total) by mouth daily. 10 tablet 0    BP 119/59  Pulse 72  Temp 97.8 F (36.6 C) (Oral)  Resp 16  SpO2 100%  Physical Exam  Nursing note and vitals reviewed. GEN: Well-developed, well-nourished female in no distress HEENT: Atraumatic, normocephalic. Oropharynx clear without erythema.  EYES: PERRLA BL, no scleral icterus. NECK: Trachea midline, no meningismus. Pain free full ROM CV: regular rate and rhythm. No murmurs, rubs, or gallops PULM: No respiratory distress.  No crackles, wheezes, or rales. GI: soft, non-tender. No guarding, rebound, or tenderness. + bowel sounds  GU: deferred Neuro: cranial nerves grossly 2-12 intact, no abnormalities of strength or sensation, A and O x 3, no pronator drift, no dysmetria on finger to nose task BL MSK:  Patient moves all 4 extremities symmetrically, no deformity, edema, or injury noted Skin: No rashes petechiae, purpura, or jaundice Psych: no abnormality of mood   ED Course  Procedures (including critical care time)  Labs Reviewed  CBC WITH DIFFERENTIAL - Abnormal; Notable for the following:    Neutrophils Relative 80 (*)     Neutro Abs 8.3 (*)     Lymphocytes Relative 10 (*)     Monocytes Absolute 1.1 (*)     All other components within normal limits  BASIC METABOLIC PANEL - Abnormal; Notable for the following:    Glucose, Bld 122 (*)     GFR calc non Af Amer 80 (*)     All other components within normal limits  LAB REPORT - SCANNED   Ct Head Wo Contrast  04/27/2012  *RADIOLOGY REPORT*  Clinical Data: Headache, history of paranasal sinus disease  CT HEAD WITHOUT CONTRAST  Technique:  Contiguous axial images were obtained from the base of the skull through the vertex without contrast.  Comparison: MR brain of 08/01/2011 and CT brain of 03/04/2008  Findings: The ventricular system is prominent as are the cortical sulci, indicative of diffuse atrophy.  The septum remains midline. There has been some progression of moderate small vessel ischemic change throughout the periventricular white matter.  No hemorrhage, mass lesion, or acute infarction is seen.  There is complete opacification of sphenoid sinus with opacification of multiple ethmoid air cells and probably of the right maxillary sinus.  These findings are consistent with severe pansinusitis.  All of the paranasal sinuses are not evaluated on this exam.  The mastoid air cells appear pneumatized.  No calvarial abnormality is seen.  IMPRESSION:  1.  Some progression of small vessel ischemic change.  No acute intracranial abnormality. 2.  Severe pansinusitis.  Original Report Authenticated By: Juline Patch, M.D.     1. Sinusitis       MDM  Patient was evaluated by myself.  She denied any neurologic changes and had no focal  neurologic symptoms.  She had had a prolonged period of headache with no history of the same. Vital signs were stable.  Patient had head CT demonstrating pan-sinusitis.   Patient was given half dosing of headache cocktail with reglan 5 mg and benadryl 12.5 mg IV.  She was given IVF as well.  Headache was improving and patient was not nauseas any longer.  Patient did have pan-sinusitis.  Given duration of symptoms and patient's complaint she was treated with Avelox and given a 10 day course.  She was d/c'ed in good condition and can follow-up with her PCP.        Cyndra Numbers, MD 04/28/12 1220

## 2012-04-29 ENCOUNTER — Inpatient Hospital Stay (HOSPITAL_COMMUNITY)
Admission: EM | Admit: 2012-04-29 | Discharge: 2012-05-01 | DRG: 133 | Disposition: A | Discharge status: Home Health | Payer: Medicare Other | Attending: Internal Medicine | Admitting: Internal Medicine

## 2012-04-29 ENCOUNTER — Encounter (HOSPITAL_COMMUNITY): Payer: Self-pay | Admitting: *Deleted

## 2012-04-29 DIAGNOSIS — R600 Localized edema: Secondary | ICD-10-CM

## 2012-04-29 DIAGNOSIS — E876 Hypokalemia: Secondary | ICD-10-CM | POA: Diagnosis not present

## 2012-04-29 DIAGNOSIS — Z79899 Other long term (current) drug therapy: Secondary | ICD-10-CM

## 2012-04-29 DIAGNOSIS — R609 Edema, unspecified: Secondary | ICD-10-CM | POA: Diagnosis present

## 2012-04-29 DIAGNOSIS — J329 Chronic sinusitis, unspecified: Principal | ICD-10-CM | POA: Diagnosis present

## 2012-04-29 DIAGNOSIS — E871 Hypo-osmolality and hyponatremia: Secondary | ICD-10-CM | POA: Diagnosis present

## 2012-04-29 DIAGNOSIS — Z66 Do not resuscitate: Secondary | ICD-10-CM | POA: Diagnosis present

## 2012-04-29 DIAGNOSIS — E039 Hypothyroidism, unspecified: Secondary | ICD-10-CM | POA: Diagnosis present

## 2012-04-29 DIAGNOSIS — E785 Hyperlipidemia, unspecified: Secondary | ICD-10-CM | POA: Diagnosis present

## 2012-04-29 DIAGNOSIS — R51 Headache: Secondary | ICD-10-CM | POA: Diagnosis present

## 2012-04-29 DIAGNOSIS — F3289 Other specified depressive episodes: Secondary | ICD-10-CM | POA: Diagnosis present

## 2012-04-29 DIAGNOSIS — R1310 Dysphagia, unspecified: Secondary | ICD-10-CM | POA: Diagnosis present

## 2012-04-29 DIAGNOSIS — R112 Nausea with vomiting, unspecified: Secondary | ICD-10-CM | POA: Diagnosis present

## 2012-04-29 DIAGNOSIS — F329 Major depressive disorder, single episode, unspecified: Secondary | ICD-10-CM | POA: Diagnosis present

## 2012-04-29 DIAGNOSIS — E860 Dehydration: Secondary | ICD-10-CM | POA: Diagnosis present

## 2012-04-29 DIAGNOSIS — H3530 Unspecified macular degeneration: Secondary | ICD-10-CM | POA: Diagnosis present

## 2012-04-29 LAB — HEPATIC FUNCTION PANEL
ALT: 26 U/L (ref 0–35)
Alkaline Phosphatase: 120 U/L — ABNORMAL HIGH (ref 39–117)
Bilirubin, Direct: <0.1 mg/dL (ref 0.0–0.3)
Total Bilirubin: 0.2 mg/dL — ABNORMAL LOW (ref 0.3–1.2)
Total Protein: 6.7 g/dL (ref 6.0–8.3)

## 2012-04-29 LAB — BASIC METABOLIC PANEL
Chloride: 99 mEq/L (ref 96–112)
GFR calc Af Amer: >90 mL/min (ref >90)
GFR calc non Af Amer: 85 mL/min — ABNORMAL LOW (ref >90)
Potassium: 3.7 mEq/L (ref 3.5–5.1)

## 2012-04-29 LAB — TSH: TSH: 8.884 u[IU]/mL — ABNORMAL HIGH (ref 0.350–4.500)

## 2012-04-29 LAB — CBC
MCHC: 33.3 g/dL (ref 30.0–36.0)
Platelets: 270 10*3/uL (ref 150–400)
RDW: 13.6 % (ref 11.5–15.5)
WBC: 9.8 10*3/uL (ref 4.0–10.5)

## 2012-04-29 LAB — SEDIMENTATION RATE: Sed Rate: 56 mm/hr — ABNORMAL HIGH (ref 0–22)

## 2012-04-29 LAB — C-REACTIVE PROTEIN: CRP: 6.4 mg/dL — ABNORMAL HIGH (ref <0.60)

## 2012-04-29 MED ORDER — SODIUM CHLORIDE 0.9 % IV SOLN
1.5000 g | Freq: Four times a day (QID) | INTRAVENOUS | Status: DC
  Administered 2012-04-29 – 2012-05-01 (×9): 1.5 g via INTRAVENOUS
  Filled 2012-04-29 (×10): qty 1.5

## 2012-04-29 MED ORDER — PANTOPRAZOLE SODIUM 40 MG PO TBEC
40.0000 mg | DELAYED_RELEASE_TABLET | Freq: Every day | ORAL | Status: DC
  Administered 2012-04-29 – 2012-05-01 (×3): 40 mg via ORAL
  Filled 2012-04-29 (×3): qty 1

## 2012-04-29 MED ORDER — ONDANSETRON HCL 4 MG PO TABS: 4.0000 mg | ORAL_TABLET | Freq: Four times a day (QID) | ORAL | Status: DC | PRN

## 2012-04-29 MED ORDER — HYDROCODONE-ACETAMINOPHEN 5-325 MG PO TABS
1.0000 | ORAL_TABLET | ORAL | Status: DC | PRN
  Administered 2012-04-30 (×2): 2 via ORAL
  Filled 2012-04-29: qty 2
  Filled 2012-04-29: qty 1
  Filled 2012-04-29: qty 2

## 2012-04-29 MED ORDER — PROSIGHT PO TABS
1.0000 | ORAL_TABLET | Freq: Every day | ORAL | Status: DC
  Administered 2012-04-29 – 2012-05-01 (×3): 1 via ORAL
  Filled 2012-04-29 (×3): qty 1

## 2012-04-29 MED ORDER — OCUVITE PRESERVISION PO TABS: 1.0000 | ORAL_TABLET | Freq: Every day | ORAL | Status: DC

## 2012-04-29 MED ORDER — ACETAMINOPHEN 325 MG PO TABS
650.0000 mg | ORAL_TABLET | Freq: Four times a day (QID) | ORAL | Status: DC | PRN
  Administered 2012-04-29 – 2012-04-30 (×2): 650 mg via ORAL
  Filled 2012-04-29: qty 2

## 2012-04-29 MED ORDER — PREDNISONE 50 MG PO TABS
60.0000 mg | ORAL_TABLET | Freq: Every day | ORAL | Status: DC
  Administered 2012-04-30 – 2012-05-01 (×2): 60 mg via ORAL
  Filled 2012-04-29 (×3): qty 1

## 2012-04-29 MED ORDER — FLUOXETINE HCL 20 MG PO TABS
20.0000 mg | ORAL_TABLET | Freq: Every day | ORAL | Status: DC
  Administered 2012-04-30 – 2012-05-01 (×2): 20 mg via ORAL
  Filled 2012-04-29 (×3): qty 1

## 2012-04-29 MED ORDER — SODIUM CHLORIDE 0.9 % IV BOLUS (SEPSIS)
1000.0000 mL | Freq: Once | INTRAVENOUS | Status: AC
  Administered 2012-04-29: 1000 mL via INTRAVENOUS

## 2012-04-29 MED ORDER — ONDANSETRON HCL 4 MG/2ML IJ SOLN: 4.0000 mg | Freq: Four times a day (QID) | INTRAMUSCULAR | Status: DC | PRN

## 2012-04-29 MED ORDER — ADULT MULTIVITAMIN W/MINERALS CH
1.0000 | ORAL_TABLET | Freq: Every day | ORAL | Status: DC
  Administered 2012-04-29 – 2012-05-01 (×3): 1 via ORAL
  Filled 2012-04-29 (×3): qty 1

## 2012-04-29 MED ORDER — SODIUM CHLORIDE 0.9 % IV SOLN
1.5000 g | INTRAVENOUS | Status: AC
  Administered 2012-04-29: 1.5 g via INTRAVENOUS
  Filled 2012-04-29: qty 1.5

## 2012-04-29 MED ORDER — PREDNISONE 20 MG PO TABS
60.0000 mg | ORAL_TABLET | Freq: Once | ORAL | Status: AC
  Administered 2012-04-29: 60 mg via ORAL
  Filled 2012-04-29: qty 3

## 2012-04-29 MED ORDER — SODIUM CHLORIDE 0.9 % IV SOLN
INTRAVENOUS | Status: AC
  Administered 2012-04-29: 12:00:00 via INTRAVENOUS

## 2012-04-29 MED ORDER — ATORVASTATIN CALCIUM 40 MG PO TABS
40.0000 mg | ORAL_TABLET | Freq: Every day | ORAL | Status: DC
  Administered 2012-04-29 – 2012-04-30 (×2): 40 mg via ORAL
  Filled 2012-04-29 (×3): qty 1

## 2012-04-29 MED ORDER — THERA M PLUS PO TABS
1.0000 | ORAL_TABLET | Freq: Every day | ORAL | Status: DC
Start: 2012-04-29 — End: 2012-04-29

## 2012-04-29 MED ORDER — SALINE SPRAY 0.65 % NA SOLN
2.0000 | Freq: Two times a day (BID) | NASAL | Status: DC
  Administered 2012-04-29 – 2012-05-01 (×5): 2 via NASAL
  Filled 2012-04-29: qty 44

## 2012-04-29 MED ORDER — ZOLPIDEM TARTRATE 5 MG PO TABS
5.0000 mg | ORAL_TABLET | Freq: Every evening | ORAL | Status: DC | PRN
  Administered 2012-04-29 – 2012-04-30 (×2): 5 mg via ORAL
  Filled 2012-04-29 (×2): qty 1

## 2012-04-29 MED ORDER — BUPROPION HCL ER (XL) 300 MG PO TB24
300.0000 mg | ORAL_TABLET | Freq: Every day | ORAL | Status: DC
  Administered 2012-04-30 – 2012-05-01 (×2): 300 mg via ORAL
  Filled 2012-04-29 (×3): qty 1

## 2012-04-29 MED ORDER — ACETAMINOPHEN 650 MG RE SUPP: 650.0000 mg | Freq: Four times a day (QID) | RECTAL | Status: DC | PRN

## 2012-04-29 MED ORDER — METOCLOPRAMIDE HCL 5 MG/ML IJ SOLN
10.0000 mg | Freq: Once | INTRAMUSCULAR | Status: AC
  Administered 2012-04-29: 10 mg via INTRAVENOUS
  Filled 2012-04-29: qty 2

## 2012-04-29 MED ORDER — LEVOTHYROXINE SODIUM 112 MCG PO TABS
112.0000 ug | ORAL_TABLET | Freq: Every day | ORAL | Status: DC
  Administered 2012-04-30 – 2012-05-01 (×2): 112 ug via ORAL
  Filled 2012-04-29 (×4): qty 1

## 2012-04-29 MED ORDER — SODIUM CHLORIDE 0.9 % IV SOLN
INTRAVENOUS | Status: DC
  Administered 2012-04-29 – 2012-04-30 (×2): via INTRAVENOUS

## 2012-04-29 MED ORDER — MORPHINE SULFATE 2 MG/ML IJ SOLN: 1.0000 mg | INTRAMUSCULAR | Status: DC | PRN

## 2012-04-29 MED ORDER — LORATADINE 10 MG PO TABS
10.0000 mg | ORAL_TABLET | Freq: Every day | ORAL | Status: DC
  Administered 2012-04-29 – 2012-05-01 (×3): 10 mg via ORAL
  Filled 2012-04-29 (×3): qty 1

## 2012-04-29 NOTE — ED Notes (Signed)
Attempted to call report.  Nurse in a room.

## 2012-04-29 NOTE — Consult Note (Signed)
Christina Shepherd [facility]  Nov 01, 1930 [id number]  CARE TEAM:  PCP: No primary provider on file.  Outpatient Care Team: Patient has no care team.  Inpatient Treatment Team: Treatment Team: Attending Provider: Vassie Loll, MD; Rounding Team: Delmer Islam, MD; Technician: Berlinda Last, [place]; Registered Nurse: Bebe Shaggy, RN; Consulting Physician: Bishop Limbo, MD   This patient is a 78 y.o.female who presents today for surgical evaluation at the request of Dr. Gwenlyn Perking.   Reason for evaluation: Left-sided headache and vision changes.  Concern of temporal arteritis.  Patient is a pleasant female lives alone.  Has a history of right-sided macular degeneration which is chronically stable.  She has noted some vision changes on her left side and a more severe headache.Some nausea and vomiting associated with it.  CT of the head showed some sinusitis but no focal brain deficits  She has an elevated sedimentation rate.  Because it is focal with some vision changes now there is concern about a possible temporal arteritis.  She is being treated with steroids.  Temporal artery biopsy is requested for the left side  Patient Active Problem List  Diagnosis  . Left temporal headache  . Nausea & vomiting  . Sinusitis  . HLD (hyperlipidemia)  . Dehydration with hyponatremia  . Depression  . Hypothyroidism  . Bilateral leg edema    Past Medical History  Diagnosis Date  . Elevated cholesterol     Past Surgical History  Procedure Date  . Abdominal hysterectomy   . Knee surgery     History   Social History  . Marital Status: Married    Spouse Name: N/A    Number of Children: N/A  . Years of Education: N/A   Occupational History  . Not on file.   Social History Main Topics  . Smoking status: Not on file  . Smokeless tobacco: Not on file  . Alcohol Use: 0.6 oz/week    1 Glasses of wine per week  . Drug Use: No  . Sexually Active: Not Currently   Other Topics Concern  . Not on file    Social History Narrative  . No narrative on file    History reviewed. No pertinent family history.  Current Facility-Administered Medications  Medication Dose Route Frequency Provider Last Rate Last Dose  . 0.9 %  sodium chloride infusion   Intravenous STAT Doug Sou, MD      . 0.9 %  sodium chloride infusion   Intravenous Continuous Vassie Loll, MD      . acetaminophen (TYLENOL) tablet 650 mg  650 mg Oral Q6H PRN Vassie Loll, MD       Or  . acetaminophen (TYLENOL) suppository 650 mg  650 mg Rectal Q6H PRN Vassie Loll, MD      . ampicillin-sulbactam (UNASYN) 1.5 g in sodium chloride 0.9 % 50 mL IVPB  1.5 g Intravenous NOW Annia Belt, PHARMD   1.5 g at 04/29/12 0915  . ampicillin-sulbactam (UNASYN) 1.5 g in sodium chloride 0.9 % 50 mL IVPB  1.5 g Intravenous Q6H Annia Belt, PHARMD      . atorvastatin (LIPITOR) tablet 40 mg  40 mg Oral q1800 Vassie Loll, MD      . buPROPion (WELLBUTRIN XL) 24 hr tablet 300 mg  300 mg Oral Daily Vassie Loll, MD      . FLUoxetine (PROZAC) tablet 20 mg  20 mg Oral Daily Vassie Loll, MD      . HYDROcodone-acetaminophen (NORCO/VICODIN) 5-325 MG per tablet  1-2 tablet  1-2 tablet Oral Q4H PRN Vassie Loll, MD      . levothyroxine (SYNTHROID, LEVOTHROID) tablet 112 mcg  112 mcg Oral QAC breakfast Vassie Loll, MD      . loratadine (CLARITIN) tablet 10 mg  10 mg Oral Daily Vassie Loll, MD      . metoCLOPramide (REGLAN) injection 10 mg  10 mg Intravenous Once Doug Sou, MD   10 mg at 04/29/12 0451  . morphine 2 MG/ML injection 1 mg  1 mg Intravenous Q4H PRN Vassie Loll, MD      . multivitamin (PROSIGHT) tablet 1 tablet  1 tablet Oral Daily Vassie Loll, MD      . multivitamin with minerals tablet 1 tablet  1 tablet Oral Daily Vassie Loll, MD      . ondansetron [facility]) tablet 4 mg  4 mg Oral Q6H PRN Vassie Loll, MD       Or  . ondansetron [facility]) injection 4 mg  4 mg Intravenous Q6H PRN Vassie Loll, MD        . pantoprazole (PROTONIX) EC tablet 40 mg  40 mg Oral Q1200 Vassie Loll, MD      . predniSONE (DELTASONE) tablet 60 mg  60 mg Oral Once Doug Sou, MD   60 mg at 04/29/12 0717  . predniSONE (DELTASONE) tablet 60 mg  60 mg Oral Q breakfast Vassie Loll, MD      . sodium chloride (OCEAN) 0.65 % nasal spray 2 spray  2 spray Each Nare Q12H Vassie Loll, MD      . sodium chloride 0.9 % bolus 1,000 mL  1,000 mL Intravenous Once Doug Sou, MD   1,000 mL at 04/29/12 0451  . zolpidem (AMBIEN) tablet 5 mg  5 mg Oral QHS PRN Vassie Loll, MD      . DISCONTD: multivitamins ther. w/minerals tablet 1 tablet  1 tablet Oral Daily Vassie Loll, MD      . DISCONTD: Idolina Primer PreserVision TABS 1 tablet  1 tablet Oral Daily Vassie Loll, MD         No Known Allergies  ROS: Constitutional:  No fevers, chills, sweats.  Weight stable Eyes:  No discharge.  R eye with MD but stable HENT:  No sore throats, nasal drainage Lymph: No neck swelling, No bruising easily Pulmonary:  No cough, productive sputum CV: No orthopnea, PND  Patient walks 30 minutes for about .5 miles without difficulty.  No exertional chest/neck/shoulder/arm pain. GI:  No recent sick contacts/gastroenteritis.  No travel outside the country.  No changes in diet. Renal: No UTIs, No hematuria Genital:  No drainage, bleeding, masses Musculoskeletal: No severe joint pain.  Good ROM major joints Skin:  No sores or lesions.  No rashes Heme/Lymph:  No easy bleeding.  No swollen lymph nodes  BP 123/66  Pulse 64  Temp 98.7 F (37.1 C) (Oral)  Resp 18  Ht 5\' 2"  (1.575 m)  Wt 140 lb (63.504 kg)  BMI 25.61 kg/m2  SpO2 99%  Physical Exam: General: Pt awake/alert/oriented x4 in no major acute distress Eyes: PERRL, normal EOM. Sclera nonicteric Neuro: CN II-XII intact w/o focal sensory/motor deficits.  Only subtle facial asymmetry within normal range.  No facial droop Lymph: No head/neck/groin lymphadenopathy Psych:  No  delerium/psychosis/paranoia HENT: Normocephalic, Mucus membranes moist.  No thrush Neck: Supple, No tracheal deviation Chest: No pain.  Good respiratory excursion. CV:  Pulses intact.  Regular rhythm Abdomen: Soft, Nondistended.  Nontender.  No incarcerated hernias. Ext:  SCDs  BLE.  No significant edema.  No cyanosis Skin: No petechiae / purpurae  Results:   Labs: Results for orders placed during the hospital encounter of 04/29/12 (from the past 48 hour(s))  CBC     Status: Normal   Collection Time   04/29/12  4:00 AM      Component Value Range Comment   WBC 9.8  4.0 - 10.5 K/uL    RBC 4.35  3.87 - 5.11 MIL/uL    Hemoglobin 12.5  12.0 - 15.0 g/dL    HCT 40.9  81.1 - 91.4 %    MCV 86.2  78.0 - 100.0 fL    MCH 28.7  26.0 - 34.0 pg    MCHC 33.3  30.0 - 36.0 g/dL    RDW 78.2  95.6 - 21.3 %    Platelets 270  150 - 400 K/uL   BASIC METABOLIC PANEL     Status: Abnormal   Collection Time   04/29/12  4:00 AM      Component Value Range Comment   Sodium 133 (*) 135 - 145 mEq/L    Potassium 3.7  3.5 - 5.1 mEq/L    Chloride 99  96 - 112 mEq/L    CO2 22  19 - 32 mEq/L    Glucose, Bld 120 (*) 70 - 99 mg/dL    BUN 13  6 - 23 mg/dL    Creatinine, Ser 0.86  0.50 - 1.10 mg/dL    Calcium 9.4  8.4 - 57.8 mg/dL    GFR calc non Af Amer 85 (*) >90 mL/min    GFR calc Af Amer >90  >90 mL/min   SEDIMENTATION RATE     Status: Abnormal   Collection Time   04/29/12  4:50 AM      Component Value Range Comment   Sed Rate 56 (*) 0 - 22 mm/hr     Imaging / Studies: Ct Head Wo Contrast  04/27/2012  *RADIOLOGY REPORT*  Clinical Data: Headache, history of paranasal sinus disease  CT HEAD WITHOUT CONTRAST  Technique:  Contiguous axial images were obtained from the base of the skull through the vertex without contrast.  Comparison: MR brain of 08/01/2011 and CT brain of 03/04/2008  Findings: The ventricular system is prominent as are the cortical sulci, indicative of diffuse atrophy.  The septum remains  midline. There has been some progression of moderate small vessel ischemic change throughout the periventricular white matter.  No hemorrhage, mass lesion, or acute infarction is seen.  There is complete opacification of sphenoid sinus with opacification of multiple ethmoid air cells and probably of the right maxillary sinus.  These findings are consistent with severe pansinusitis.  All of the paranasal sinuses are not evaluated on this exam.  The mastoid air cells appear pneumatized.  No calvarial abnormality is seen.  IMPRESSION:  1.  Some progression of small vessel ischemic change.  No acute intracranial abnormality. 2.  Severe pansinusitis.  Original Report Authenticated By: Juline Patch, M.D.    Medications / Allergies: per chart  Antibiotics: Anti-infectives     Start     Dose/Rate Route Frequency Ordered Stop   04/29/12 1600   ampicillin-sulbactam (UNASYN) 1.5 g in sodium chloride 0.9 % 50 mL IVPB        1.5 g 100 mL/hr over 30 Minutes Intravenous Every 6 hours 04/29/12 0820     04/29/12 0830   ampicillin-sulbactam (UNASYN) 1.5 g in sodium chloride 0.9 % 50 mL IVPB  1.5 g 100 mL/hr over 30 Minutes Intravenous NOW 04/29/12 0820 04/29/12 0945          Assessment  Christina Shepherd  78 y.o. female       Problem List:  Principal Problem:  *Left temporal headache Active Problems:  Nausea & vomiting  Sinusitis  HLD (hyperlipidemia)  Dehydration with hyponatremia  Depression  Hypothyroidism  Bilateral leg edema  Headache w vision changes, probable temporal arteritis  Plan:  Temporal artery biopsy:  The pathophysiology of temporal arteritis was discussed.  Differential diagnosis is discussed.  Recommend is made for an excision of a segment of the temporal artery front of the ear as an excisional biopsy for diagnostic purposes.  We plan to do it on the same side where the patient is having the symptoms.  Risks of bleeding, infection, nerve injury and other risks were  discussed in detail.  Questions were answered.  The patient agrees to proceed  We will plan to do this tomorrow morning.  Agree with preemptive steroid treatment.  My partner, Dr. Gerrit Friends will most likely do it  Ardeth Sportsman, M.D., F.A.C.S. Gastrointestinal and Minimally Invasive Surgery [facility] [address] [place], [place] [address] [phone] Main / Paging [phone] Voice Mail   04/29/2012

## 2012-04-29 NOTE — Progress Notes (Signed)
ANTIBIOTIC CONSULT NOTE - INITIAL  Pharmacy Consult for Unasyn Indication: Pansinusitis  No Known Allergies  Patient Measurements: Height: 5\' 2"  (157.5 cm) Weight: 140 lb (63.504 kg) IBW/kg (Calculated) : 50.1   Vital Signs: Temp: 97.6 F (36.4 C) (08/18 0316) Temp src: Oral (08/18 0316) BP: 111/61 mmHg (08/18 0805) Pulse Rate: 68  (08/18 0805) Intake/Output from previous day:   Intake/Output from this shift:    Labs:  Basename 04/29/12 0400 04/27/12 1045  WBC 9.8 10.4  HGB 12.5 12.6  PLT 270 243  LABCREA -- --  CREATININE 0.59 0.72   Estimated Creatinine Clearance: 50 ml/min (by C-G formula based on Cr of 0.59). No results found for this basename: VANCOTROUGH:2,VANCOPEAK:2,VANCORANDOM:2,GENTTROUGH:2,GENTPEAK:2,GENTRANDOM:2,TOBRATROUGH:2,TOBRAPEAK:2,TOBRARND:2,AMIKACINPEAK:2,AMIKACINTROU:2,AMIKACIN:2, in the last 72 hours   Microbiology: No results found for this or any previous visit (from the past 720 hour(s)).  Medical History: Past Medical History  Diagnosis Date  . Elevated cholesterol     Medications:  Anti-infectives    None     Assessment: 78 yo F here on 23 hour observation, to start Unasyn per Rx for pansinusitis. Was seen in ER on 8/16 and started on moxifloxacin; however, pt is unable to tolerate this (N/V).  Plan:  1) Start Unasyn 1.5g IV q6h  Darrol Angel, PharmD Pager: [phone] 04/29/2012,8:15 AM

## 2012-04-29 NOTE — ED Notes (Signed)
Attempted to call report.  Nurse busy. 

## 2012-04-29 NOTE — ED Notes (Signed)
Pt c/o continued headache, seen on 8/16 for same, unable to keep medication down that was rx'd for her on Friday night.

## 2012-04-29 NOTE — H&P (Addendum)
[facility] History and Physical  Christina Shepherd:[id number] DOB: May 14, 1931 DOA: 04/29/2012  Referring physician: Dr. Ethelda Chick PCP: No primary provider on file.   Chief Complaint: Left temporal HA, nausea and vomiting  HPI:  78 year old female with a past medical history significant for hyperlipidemia, hypothyroidism, depression, and chronic sinusitis; came to the hospital complaining of left side headache for the last 4-5 days. Patient reports that the pain has been gradually worsening and over the last day or 2 associated with some blurred vision and photophobia. Patient was recently seen in the ED secondary to her headache and about moment a CT scan of her head demonstrated pansinusitis patient was started on treatment for her sinusitis but despite the treatment she had had no improvement in her headache. In the ED her sedimentation rate was higher than 50, was having pain to palpation in her temporal area and because of the vision changes [facility] has been called to admit the patient for further evaluation and treatment and to rule out GAC. The patient denies fever, chills, chest pain, neck rigidity, hematemesis or melena.  Patient endorses for the last 24-36 hours nausea, 4 episodes of vomiting, decreased by mouth intake; these symptoms started after the use of Avelox prescribed for her sinusitis during prior ED visit.  Review of Systems:  Negative except as otherwise mentioned on history of present illness.  Past Medical History  Diagnosis Date  . Elevated cholesterol    Past Surgical History  Procedure Date  . Abdominal hysterectomy   . Knee surgery    Social History:  does not have a smoking history on file. She does not have any smokeless tobacco history on file. She reports that she drinks about .6 ounces of alcohol per week. She reports that she does not use illicit drugs. patient came from home, leave by herself and do not require any assistance with  activities of daily living.   No Known Allergies  Family history: per patient significant only for hypertension on her father and mother; otherwise unremarkable   Prior to Admission medications   Medication Sig Start Date End Date Taking? Authorizing Provider  buPROPion (WELLBUTRIN XL) 300 MG 24 hr tablet Take 300 mg by mouth daily.     Yes Historical Provider, MD  FLUoxetine (PROZAC) 20 MG tablet Take 20 mg by mouth daily.     Yes Historical Provider, MD  furosemide (LASIX) 40 MG tablet Take 40 mg by mouth daily as needed. For swelling    Yes Historical Provider, MD  levothyroxine (SYNTHROID, LEVOTHROID) 112 MCG tablet Take 112 mcg by mouth daily.     Yes Historical Provider, MD  moxifloxacin (AVELOX) 400 MG tablet Take 1 tablet (400 mg total) by mouth daily. 04/27/12 05/07/12 Yes Cyndra Numbers, MD  Multiple Vitamins-Minerals (MULTIVITAMINS THER. W/MINERALS) TABS Take 1 tablet by mouth daily.     Yes Historical Provider, MD  Multiple Vitamins-Minerals (OCUVITE PRESERVISION PO) Take 1 capsule by mouth 2 (two) times daily.     Yes Historical Provider, MD  naproxen sodium (ANAPROX) 220 MG tablet Take 440 mg by mouth daily as needed. For pain    Yes Historical Provider, MD  rosuvastatin (CRESTOR) 20 MG tablet Take 20 mg by mouth daily.     Yes Historical Provider, MD  spironolactone (ALDACTONE) 25 MG tablet Take 25 mg by mouth 2 (two) times daily as needed. For leg edema    Yes Historical Provider, MD  traMADol (ULTRAM) 50 MG tablet Take 50 mg by  mouth 2 (two) times daily as needed. For pain. Maximum dose= 8 tablets per day    Yes Historical Provider, MD  zolpidem (AMBIEN) 5 MG tablet Take 5 mg by mouth at bedtime.     Yes Historical Provider, MD   Physical Exam: Filed Vitals:   04/29/12 0316 04/29/12 0636 04/29/12 0805 04/29/12 1014  BP: 155/127 116/43 111/61 123/66  Pulse: 73 66 68 64  Temp: 97.6 F (36.4 C)   98.7 F (37.1 C)  TempSrc: Oral   Oral  Resp: 16 18 17 18   Height:   5\' 2"   (1.575 m)   Weight:   63.504 kg (140 lb)   SpO2: 98% 97% 97% 99%     General:  No acute distress, alert, awake and oriented x3; cooperative to examination.  Eyes: No icterus, PERRLA, extra focal or muscles intact; patient complaining of photophobia and mild blurred vision.  ENT: No erythema or exudates inside her mouth, no drainage is appreciated out of her ears or nostrils; nasal congestion and tenderness when palpating on her paranasal sinuses area appreciated  Neck: Supple, no thyromegaly, no bruits  Cardiovascular: S1 and S2, no rubs and no gallops appreciated on exam  Respiratory: Clear to auscultation bilaterally;  Abdomen: Soft, nontender, nondistended; positive bowel sounds  Skin: No rash, no petechiae  Musculoskeletal: No joint swelling or erythema.  Psychiatric: Stable mood and appropriate behavior.  Neurologic: Alert, awake and oriented x3, cranial nerves grossly intact, muscle strength 5 out of 5 bilaterally and symmetrically; no focal motor or sensory deficit.  Labs on Admission:  Basic Metabolic Panel:  Lab 04/29/12 4540 04/27/12 1045  NA 133* 136  K 3.7 4.1  CL 99 100  CO2 22 25  GLUCOSE 120* 122*  BUN 13 15  CREATININE 0.59 0.72  CALCIUM 9.4 9.4  MG -- --  PHOS -- --   CBC:  Lab 04/29/12 0400 04/27/12 1045  WBC 9.8 10.4  NEUTROABS -- 8.3*  HGB 12.5 12.6  HCT 37.5 37.1  MCV 86.2 87.3  PLT 270 243    Radiological Exams on Admission: Ct Head Wo Contrast  04/27/2012  *RADIOLOGY REPORT*  Clinical Data: Headache, history of paranasal sinus disease  CT HEAD WITHOUT CONTRAST  Technique:  Contiguous axial images were obtained from the base of the skull through the vertex without contrast.  Comparison: MR brain of 08/01/2011 and CT brain of 03/04/2008  Findings: The ventricular system is prominent as are the cortical sulci, indicative of diffuse atrophy.  The septum remains midline. There has been some progression of moderate small vessel ischemic change  throughout the periventricular white matter.  No hemorrhage, mass lesion, or acute infarction is seen.  There is complete opacification of sphenoid sinus with opacification of multiple ethmoid air cells and probably of the right maxillary sinus.  These findings are consistent with severe pansinusitis.  All of the paranasal sinuses are not evaluated on this exam.  The mastoid air cells appear pneumatized.  No calvarial abnormality is seen.  IMPRESSION:  1.  Some progression of small vessel ischemic change.  No acute intracranial abnormality. 2.  Severe pansinusitis.  Original Report Authenticated By: Juline Patch, M.D.    Assessment/Plan 1-Left temporal headache: Patient headache has been present for the last 4 days and worsening; it is localized to her left temporal area. She was initially seen in the ED and thought that her pain was secondary to pansinusitis as per CT of her head demonstrated during that visit. Patient  was discharged home, but despite symptomatic treatment and antibiotics for her sinusitis her headache has continue and is now associated with photophobia and some blurred vision. Do to elevated sedimentation rate, tender to palpation in her temporal area and vision changes patient has been admitted to rule out temporal arteritis and to control her symptoms. Will provide pain medications as needed, start patient on prednisone, arrange for temporal artery biopsy with general surgery. Will also start the patient on Unasyn for her sinusitis since she was unable to tolerate PO abx's. Will also start loratadine daily, and saline spray twice a day.  2-Nausea & vomiting: Most likely associated with mild gastritis due to the use of antibiotics. Patient denies any diarrhea and is currently no having any fever or elevation of WBCs. Will advance diet as tolerated starting with clear liquids, provide when necessary antiemetics and gentle hydration.  3-Sinusitis: As mentioned above patient will be started  on daily claritin, saline sprays twice a day, and will use Unasyn through her veins. Steroids that has been initiated at this moment for questionable [facility] will also help her sinusitis condition.  4-HLD (hyperlipidemia): Will check lipid profile and continue statins.  5-Dehydration with hyponatremia: Associated with decreased by mouth intake due to stomach upset symptoms and nausea/vomiting secondary to recent antibiotics use. Will provide fluid resuscitation and follow her electrolytes in the morning.   5-Depression: Stable and well controlled. Will continue Prozac and Wellbutrin.  6-Hypothyroidism: Check TSH and continue Synthroid.  7-Bilateral leg edema: Patient with venous stasis chronically and using when necessary spironolactone and Lasix. Currently just with trace edema bilaterally and in no needs for diuretics.   8-DVT: SCD's   Code Status: DO NOT RESUSCITATE Family Communication: No family at bedside; plan of care is discussed in details with patient (she is alert, awake and oriented x3 and able to make her own decisions) Disposition Plan: Home when medically stable.  Time spent: > 30 minutes  Brittainy Bucker [facility] Pager [phone]  If 7PM-7AM, please contact night-coverage [email or web address] Password [facility] 04/29/2012, 10:35 AM

## 2012-04-29 NOTE — ED Provider Notes (Signed)
History     CSN: [id number]  Arrival date & time 04/29/12  0259   First MD Initiated Contact with Patient 04/29/12 [phone]      Chief Complaint  Patient presents with  . Headache    x 1 week  . Emesis    (Consider location/radiation/quality/duration/timing/severity/associated sxs/prior treatment) HPI Complains of left-sided temporal headache onset several weeks ago. Seen here 04/27/2012 treated with Benadryl, Avelox, and GI cocktail with partial relief. Patient reports she's vomited 5 or 6 times since onset of the headache. Denies fever. Associated symptoms include photophobia. No blurred vision Had CT scan on 04/27/2012 which showed pansinusitis. Headache is throbbing in nature nonradiating severe at present. Denies nausea at present Past Medical History  Diagnosis Date  . Elevated cholesterol     Past Surgical History  Procedure Date  . Abdominal hysterectomy   . Knee surgery     History reviewed. No pertinent family history.  History  Substance Use Topics  . Smoking status: Not on file  . Smokeless tobacco: Not on file  . Alcohol Use: 0.6 oz/week    1 Glasses of wine per week    OB History    Grav Para Term Preterm Abortions TAB SAB Ect Mult Living                  Review of Systems  Eyes: Positive for photophobia.  Gastrointestinal: Positive for nausea and vomiting.  Neurological: Positive for headaches.    Allergies  Review of patient's allergies indicates no known allergies.  Home Medications   Current Outpatient Rx  Name Route Sig Dispense Refill  . BUPROPION HCL ER (XL) 300 MG PO TB24 Oral Take 300 mg by mouth daily.      Marland Kitchen FLUOXETINE HCL 20 MG PO TABS Oral Take 20 mg by mouth daily.      . FUROSEMIDE 40 MG PO TABS Oral Take 40 mg by mouth daily as needed. For swelling     . LEVOTHYROXINE SODIUM 112 MCG PO TABS Oral Take 112 mcg by mouth daily.      Marland Kitchen MOXIFLOXACIN HCL 400 MG PO TABS Oral Take 1 tablet (400 mg total) by mouth daily. 10 tablet 0  .  THERA M PLUS PO TABS Oral Take 1 tablet by mouth daily.      Idolina Primer PRESERVISION PO Oral Take 1 capsule by mouth 2 (two) times daily.      Marland Kitchen NAPROXEN SODIUM 220 MG PO TABS Oral Take 440 mg by mouth daily as needed. For pain     . ROSUVASTATIN CALCIUM 20 MG PO TABS Oral Take 20 mg by mouth daily.      Marland Kitchen SPIRONOLACTONE 25 MG PO TABS Oral Take 25 mg by mouth 2 (two) times daily as needed. For leg edema     . TRAMADOL HCL 50 MG PO TABS Oral Take 50 mg by mouth 2 (two) times daily as needed. For pain. Maximum dose= 8 tablets per day     . ZOLPIDEM TARTRATE 5 MG PO TABS Oral Take 5 mg by mouth at bedtime.        BP 155/127  Pulse 73  Temp 97.6 F (36.4 C) (Oral)  Resp 16  SpO2 98%  Physical Exam  ED Course  Procedures (including critical care time)   Labs Reviewed  CBC  BASIC METABOLIC PANEL  SEDIMENTATION RATE   Ct Head Wo Contrast  04/27/2012  *RADIOLOGY REPORT*  Clinical Data: Headache, history of paranasal sinus disease  CT HEAD WITHOUT CONTRAST  Technique:  Contiguous axial images were obtained from the base of the skull through the vertex without contrast.  Comparison: MR brain of 08/01/2011 and CT brain of 03/04/2008  Findings: The ventricular system is prominent as are the cortical sulci, indicative of diffuse atrophy.  The septum remains midline. There has been some progression of moderate small vessel ischemic change throughout the periventricular white matter.  No hemorrhage, mass lesion, or acute infarction is seen.  There is complete opacification of sphenoid sinus with opacification of multiple ethmoid air cells and probably of the right maxillary sinus.  These findings are consistent with severe pansinusitis.  All of the paranasal sinuses are not evaluated on this exam.  The mastoid air cells appear pneumatized.  No calvarial abnormality is seen.  IMPRESSION:  1.  Some progression of small vessel ischemic change.  No acute intracranial abnormality. 2.  Severe pansinusitis.   Original Report Authenticated By: Juline Patch, M.D.     No diagnosis found. 7:25 AM feels improved after treatment with intravenous Reglan.  Results for orders placed during the hospital encounter of 04/29/12  CBC      Component Value Range   WBC 9.8  4.0 - 10.5 K/uL   RBC 4.35  3.87 - 5.11 MIL/uL   Hemoglobin 12.5  12.0 - 15.0 g/dL   HCT 40.9  81.1 - 91.4 %   MCV 86.2  78.0 - 100.0 fL   MCH 28.7  26.0 - 34.0 pg   MCHC 33.3  30.0 - 36.0 g/dL   RDW 78.2  95.6 - 21.3 %   Platelets 270  150 - 400 K/uL  BASIC METABOLIC PANEL      Component Value Range   Sodium 133 (*) 135 - 145 mEq/L   Potassium 3.7  3.5 - 5.1 mEq/L   Chloride 99  96 - 112 mEq/L   CO2 22  19 - 32 mEq/L   Glucose, Bld 120 (*) 70 - 99 mg/dL   BUN 13  6 - 23 mg/dL   Creatinine, Ser 0.86  0.50 - 1.10 mg/dL   Calcium 9.4  8.4 - 57.8 mg/dL   GFR calc non Af Amer 85 (*) >90 mL/min   GFR calc Af Amer >90  >90 mL/min  SEDIMENTATION RATE      Component Value Range   Sed Rate 56 (*) 0 - 22 mm/hr   Ct Head Wo Contrast  04/27/2012  *RADIOLOGY REPORT*  Clinical Data: Headache, history of paranasal sinus disease  CT HEAD WITHOUT CONTRAST  Technique:  Contiguous axial images were obtained from the base of the skull through the vertex without contrast.  Comparison: MR brain of 08/01/2011 and CT brain of 03/04/2008  Findings: The ventricular system is prominent as are the cortical sulci, indicative of diffuse atrophy.  The septum remains midline. There has been some progression of moderate small vessel ischemic change throughout the periventricular white matter.  No hemorrhage, mass lesion, or acute infarction is seen.  There is complete opacification of sphenoid sinus with opacification of multiple ethmoid air cells and probably of the right maxillary sinus.  These findings are consistent with severe pansinusitis.  All of the paranasal sinuses are not evaluated on this exam.  The mastoid air cells appear pneumatized.  No calvarial  abnormality is seen.  IMPRESSION:  1.  Some progression of small vessel ischemic change.  No acute intracranial abnormality. 2.  Severe pansinusitis.  Original Report Authenticated By: Juline Patch,  M.D.    MDM  Spoke with Dr.Madera who made arrangements for 23 hour observation. I also spoke with Gen. surgery who will perform a temporal artery biopsy if consulted by hospitalist Diagnoses #1 headache Differential diagnosis sinusitis versus temporal arteritis #2 nausea and vomiting        Doug Sou, MD 04/29/12 8119

## 2012-04-30 ENCOUNTER — Encounter (HOSPITAL_COMMUNITY): Payer: Self-pay | Admitting: *Deleted

## 2012-04-30 ENCOUNTER — Encounter (HOSPITAL_COMMUNITY)
Admission: EM | Disposition: A | Discharge status: Home Health | Payer: Self-pay | Source: Home / Self Care | Attending: Internal Medicine

## 2012-04-30 ENCOUNTER — Inpatient Hospital Stay (HOSPITAL_COMMUNITY): Payer: Medicare Other | Admitting: *Deleted

## 2012-04-30 ENCOUNTER — Encounter (HOSPITAL_COMMUNITY): Payer: Self-pay

## 2012-04-30 DIAGNOSIS — E876 Hypokalemia: Secondary | ICD-10-CM | POA: Diagnosis not present

## 2012-04-30 DIAGNOSIS — R1310 Dysphagia, unspecified: Secondary | ICD-10-CM

## 2012-04-30 DIAGNOSIS — F329 Major depressive disorder, single episode, unspecified: Secondary | ICD-10-CM

## 2012-04-30 HISTORY — PX: ARTERY BIOPSY: SHX891

## 2012-04-30 LAB — CBC
Hemoglobin: 11.3 g/dL — ABNORMAL LOW (ref 12.0–15.0)
MCH: 29.4 pg (ref 26.0–34.0)
MCHC: 33.9 g/dL (ref 30.0–36.0)
Platelets: 281 10*3/uL (ref 150–400)
RDW: 14 % (ref 11.5–15.5)

## 2012-04-30 LAB — BASIC METABOLIC PANEL
BUN: 12 mg/dL (ref 6–23)
Calcium: 8.7 mg/dL (ref 8.4–10.5)
Creatinine, Ser: 0.64 mg/dL (ref 0.50–1.10)
GFR calc Af Amer: >90 mL/min (ref >90)
GFR calc non Af Amer: 83 mL/min — ABNORMAL LOW (ref >90)
Glucose, Bld: 109 mg/dL — ABNORMAL HIGH (ref 70–99)
Potassium: 3.4 mEq/L — ABNORMAL LOW (ref 3.5–5.1)

## 2012-04-30 LAB — LIPID PANEL
Cholesterol: 149 mg/dL (ref 0–200)
Triglycerides: 100 mg/dL (ref <150)
VLDL: 20 mg/dL (ref 0–40)

## 2012-04-30 LAB — SURGICAL PCR SCREEN: Staphylococcus aureus: NEGATIVE

## 2012-04-30 SURGERY — BIOPSY TEMPORAL ARTERY
Anesthesia: General | Site: Head | Laterality: Left | Wound class: Clean

## 2012-04-30 MED ORDER — EPHEDRINE SULFATE 50 MG/ML IJ SOLN
INTRAMUSCULAR | Status: DC | PRN
  Administered 2012-04-30 (×2): 10 mg via INTRAVENOUS
  Administered 2012-04-30: 5 mg via INTRAVENOUS

## 2012-04-30 MED ORDER — DEXAMETHASONE SODIUM PHOSPHATE 10 MG/ML IJ SOLN
INTRAMUSCULAR | Status: DC | PRN
  Administered 2012-04-30: 10 mg via INTRAVENOUS

## 2012-04-30 MED ORDER — LACTATED RINGERS IV SOLN
INTRAVENOUS | Status: DC
  Administered 2012-04-30: 1000 mL via INTRAVENOUS

## 2012-04-30 MED ORDER — POTASSIUM CHLORIDE CRYS ER 20 MEQ PO TBCR
40.0000 meq | EXTENDED_RELEASE_TABLET | ORAL | Status: AC
  Administered 2012-04-30: 40 meq via ORAL
  Filled 2012-04-30: qty 2

## 2012-04-30 MED ORDER — PROMETHAZINE HCL 25 MG/ML IJ SOLN: 6.2500 mg | INTRAMUSCULAR | Status: DC | PRN

## 2012-04-30 MED ORDER — BUPIVACAINE HCL 0.5 % IJ SOLN
INTRAMUSCULAR | Status: DC | PRN
  Administered 2012-04-30: 30 mL

## 2012-04-30 MED ORDER — LACTATED RINGERS IV SOLN
INTRAVENOUS | Status: DC | PRN
  Administered 2012-04-30 (×2): via INTRAVENOUS

## 2012-04-30 MED ORDER — LACTATED RINGERS IV SOLN: INTRAVENOUS | Status: DC

## 2012-04-30 MED ORDER — FENTANYL CITRATE 0.05 MG/ML IJ SOLN: 25.0000 ug | INTRAMUSCULAR | Status: DC | PRN

## 2012-04-30 MED ORDER — LIDOCAINE HCL (CARDIAC) 20 MG/ML IV SOLN
INTRAVENOUS | Status: DC | PRN
  Administered 2012-04-30: 30 mg via INTRAVENOUS

## 2012-04-30 MED ORDER — MEPERIDINE HCL 25 MG/ML IJ SOLN: 6.2500 mg | INTRAMUSCULAR | Status: DC | PRN

## 2012-04-30 MED ORDER — METOCLOPRAMIDE HCL 5 MG/ML IJ SOLN
INTRAMUSCULAR | Status: DC | PRN
  Administered 2012-04-30: 10 mg via INTRAVENOUS

## 2012-04-30 MED ORDER — 0.9 % SODIUM CHLORIDE (POUR BTL) OPTIME
TOPICAL | Status: DC | PRN
  Administered 2012-04-30: 1000 mL

## 2012-04-30 MED ORDER — ONDANSETRON HCL 4 MG/2ML IJ SOLN: 4.0000 mg | Freq: Four times a day (QID) | INTRAMUSCULAR | Status: DC | PRN

## 2012-04-30 MED ORDER — ACETAMINOPHEN 325 MG PO TABS
650.0000 mg | ORAL_TABLET | ORAL | Status: DC | PRN
  Filled 2012-04-30: qty 2

## 2012-04-30 MED ORDER — PROPOFOL 10 MG/ML IV BOLUS
INTRAVENOUS | Status: DC | PRN
  Administered 2012-04-30: 120 mg via INTRAVENOUS

## 2012-04-30 MED ORDER — ONDANSETRON HCL 4 MG/2ML IJ SOLN
INTRAMUSCULAR | Status: DC | PRN
  Administered 2012-04-30: 4 mg via INTRAVENOUS

## 2012-04-30 MED ORDER — HYDROCODONE-ACETAMINOPHEN 5-325 MG PO TABS
1.0000 | ORAL_TABLET | ORAL | Status: DC | PRN
  Administered 2012-05-01: 1 via ORAL

## 2012-04-30 MED ORDER — FENTANYL CITRATE 0.05 MG/ML IJ SOLN
INTRAMUSCULAR | Status: DC | PRN
  Administered 2012-04-30 (×2): 25 ug via INTRAVENOUS

## 2012-04-30 MED ORDER — KCL IN DEXTROSE-NACL 20-5-0.45 MEQ/L-%-% IV SOLN
INTRAVENOUS | Status: DC
  Administered 2012-04-30: 17:00:00 via INTRAVENOUS
  Filled 2012-04-30 (×2): qty 1000

## 2012-04-30 MED ORDER — ONDANSETRON HCL 4 MG PO TABS: 4.0000 mg | ORAL_TABLET | Freq: Four times a day (QID) | ORAL | Status: DC | PRN

## 2012-04-30 MED ORDER — MIDAZOLAM HCL 5 MG/5ML IJ SOLN
INTRAMUSCULAR | Status: DC | PRN
  Administered 2012-04-30 (×2): 1 mg via INTRAVENOUS

## 2012-04-30 SURGICAL SUPPLY — 47 items
ADH SKN CLS APL DERMABOND .7 (GAUZE/BANDAGES/DRESSINGS) ×2
APL SKNCLS STERI-STRIP NONHPOA (GAUZE/BANDAGES/DRESSINGS) ×1
ATTRACTOMAT 16X20 MAGNETIC DRP (DRAPES) ×2 IMPLANT
BENZOIN TINCTURE PRP APPL 2/3 (GAUZE/BANDAGES/DRESSINGS) ×2 IMPLANT
BLADE HEX COATED 2.75 (ELECTRODE) ×2 IMPLANT
BLADE SURG 15 STRL LF DISP TIS (BLADE) IMPLANT
BLADE SURG 15 STRL SS (BLADE)
BLADE SURG SZ10 CARB STEEL (BLADE) ×2 IMPLANT
CLIP TI WIDE RED SMALL 6 (CLIP) ×2 IMPLANT
CLOTH BEACON ORANGE TIMEOUT ST (SAFETY) ×2 IMPLANT
DERMABOND ADVANCED (GAUZE/BANDAGES/DRESSINGS) ×2
DERMABOND ADVANCED .7 DNX12 (GAUZE/BANDAGES/DRESSINGS) ×2 IMPLANT
DISSECTOR ROUND CHERRY 3/8 STR (MISCELLANEOUS) ×4 IMPLANT
DRAPE PED LAPAROTOMY (DRAPES) ×2 IMPLANT
ELECT NEEDLE TIP 2.8 STRL (NEEDLE) IMPLANT
ELECT REM PT RETURN 9FT ADLT (ELECTROSURGICAL) ×2
ELECTRODE REM PT RTRN 9FT ADLT (ELECTROSURGICAL) ×1 IMPLANT
GAUZE SPONGE 4X4 16PLY XRAY LF (GAUZE/BANDAGES/DRESSINGS) ×4 IMPLANT
GLOVE BIOGEL PI IND STRL 7.0 (GLOVE) ×1 IMPLANT
GLOVE BIOGEL PI INDICATOR 7.0 (GLOVE) ×1
GLOVE ECLIPSE 8.0 STRL XLNG CF (GLOVE) ×2 IMPLANT
GLOVE INDICATOR 8.0 STRL GRN (GLOVE) ×4 IMPLANT
GOWN STRL NON-REIN LRG LVL3 (GOWN DISPOSABLE) ×2 IMPLANT
GOWN STRL REIN XL XLG (GOWN DISPOSABLE) ×4 IMPLANT
HEMOSTAT SURGICEL 2X14 (HEMOSTASIS) ×2 IMPLANT
KIT BASIN OR (CUSTOM PROCEDURE TRAY) ×2 IMPLANT
NEEDLE HYPO 25X1 1.5 SAFETY (NEEDLE) ×2 IMPLANT
NS IRRIG 1000ML POUR BTL (IV SOLUTION) ×2 IMPLANT
PACK BASIC VI WITH GOWN DISP (CUSTOM PROCEDURE TRAY) ×2 IMPLANT
PEN SKIN MARKING BROAD (MISCELLANEOUS) ×2 IMPLANT
PENCIL BUTTON HOLSTER BLD 10FT (ELECTRODE) ×2 IMPLANT
SPONGE GAUZE 4X4 12PLY (GAUZE/BANDAGES/DRESSINGS) ×2 IMPLANT
STAPLER VISISTAT 35W (STAPLE) ×2 IMPLANT
STRIP CLOSURE SKIN 1/2X4 (GAUZE/BANDAGES/DRESSINGS) ×2 IMPLANT
SUT MNCRL AB 4-0 PS2 18 (SUTURE) ×2 IMPLANT
SUT SILK 2 0 (SUTURE) ×2
SUT SILK 2-0 18XBRD TIE 12 (SUTURE) ×1 IMPLANT
SUT SILK 3 0 (SUTURE)
SUT SILK 3-0 18XBRD TIE 12 (SUTURE) IMPLANT
SUT VIC AB 3-0 SH 27 (SUTURE) ×2
SUT VIC AB 3-0 SH 27XBRD (SUTURE) ×1 IMPLANT
SUT VIC AB 4-0 PS2 27 (SUTURE) IMPLANT
SYR BULB IRRIGATION 50ML (SYRINGE) IMPLANT
SYR CONTROL 10ML LL (SYRINGE) ×2 IMPLANT
TOWEL OR 17X26 10 PK STRL BLUE (TOWEL DISPOSABLE) ×2 IMPLANT
WATER STERILE IRR 1500ML POUR (IV SOLUTION) ×2 IMPLANT
YANKAUER SUCT BULB TIP 10FT TU (MISCELLANEOUS) ×2 IMPLANT

## 2012-04-30 NOTE — Progress Notes (Signed)
General Surgery [facility]  Patient seen and examined.  Chart reviewed.  Will proceed with left temporal artery biopsy as scheduled.  The risks and benefits of the procedure have been discussed at length with the patient.  The patient understands the proposed procedure, potential alternative treatments, and the course of recovery to be expected.  All of the patient's questions have been answered at this time.  The patient wishes to proceed with surgery.  Velora Heckler, MD, [facility] Office: [phone]

## 2012-04-30 NOTE — Brief Op Note (Signed)
04/29/2012 - 04/30/2012  10:49 AM  PATIENT:  Christina Shepherd  78 y.o. female  PRE-OPERATIVE DIAGNOSIS:  Rule out temporal arteritis  POST-OPERATIVE DIAGNOSIS:  same  PROCEDURE:  Left temporal artery biopsy  SURGEON:   Velora Heckler, MD, FACS  ANESTHESIA:   general  EBL:     BLOOD ADMINISTERED:none  DRAINS: none   LOCAL MEDICATIONS USED:  MARCAINE     SPECIMEN:  Excision  DISPOSITION OF SPECIMEN:  PATHOLOGY  COUNTS:  YES  TOURNIQUET:  * No tourniquets in log *  DICTATION: .Other Dictation: Dictation Number [id number]  PLAN OF CARE: Admit to inpatient   PATIENT DISPOSITION:  PACU - hemodynamically stable.   Delay start of Pharmacological VTE agent (>24hrs) due to surgical blood loss or risk of bleeding: yes  Velora Heckler, MD, [facility] Office: [phone]

## 2012-04-30 NOTE — Transfer of Care (Signed)
Immediate Anesthesia Transfer of Care Note  Patient: Christina Shepherd [facility]  Procedure(s) Performed: Procedure(s) (LRB): BIOPSY TEMPORAL ARTERY (Left)  Patient Location: PACU  Anesthesia Type: General  Level of Consciousness: awake, sedated, patient cooperative and responds to stimulation  Airway & Oxygen Therapy: Patient Spontanous Breathing and Patient connected to face mask oxygen  Post-op Assessment: Report given to PACU RN, Post -op Vital signs reviewed and stable and Patient moving all extremities X 4  Post vital signs: Reviewed and stable  Complications: No apparent anesthesia complications

## 2012-04-30 NOTE — Anesthesia Preprocedure Evaluation (Signed)
Anesthesia Evaluation  Patient identified by MRN, date of birth, ID band Patient awake    Reviewed: Allergy & Precautions, H&P , NPO status , Patient's Chart, lab work & pertinent test results  Airway Mallampati: II      Dental  (+) Edentulous Upper and Upper Dentures   Pulmonary neg pulmonary ROS,          Cardiovascular negative cardio ROS      Neuro/Psych  Headaches, negative neurological ROS  negative psych ROS   GI/Hepatic negative GI ROS, Neg liver ROS,   Endo/Other  negative endocrine ROSHypothyroidism   Renal/GU negative Renal ROS  negative genitourinary   Musculoskeletal negative musculoskeletal ROS (+)   Abdominal   Peds negative pediatric ROS (+)  Hematology negative hematology ROS (+)   Anesthesia Other Findings   Reproductive/Obstetrics negative OB ROS                           Anesthesia Physical Anesthesia Plan  ASA: II  Anesthesia Plan:    Post-op Pain Management:    Induction: Intravenous  Airway Management Planned: LMA  Additional Equipment:   Intra-op Plan:   Post-operative Plan: Extubation in OR  Informed Consent: I have reviewed the patients History and Physical, chart, labs and discussed the procedure including the risks, benefits and alternatives for the proposed anesthesia with the patient or authorized representative who has indicated his/her understanding and acceptance.   Dental advisory given  Plan Discussed with: CRNA  Anesthesia Plan Comments:         Anesthesia Quick Evaluation

## 2012-04-30 NOTE — Anesthesia Postprocedure Evaluation (Signed)
  Anesthesia Post-op Note  Patient: Christina Shepherd [facility]  Procedure(s) Performed: Procedure(s) (LRB): BIOPSY TEMPORAL ARTERY (Left)  Patient Location: PACU  Anesthesia Type: General  Level of Consciousness: awake and alert   Airway and Oxygen Therapy: Patient Spontanous Breathing  Post-op Pain: mild  Post-op Assessment: Post-op Vital signs reviewed, Patient's Cardiovascular Status Stable, Respiratory Function Stable, Patent Airway and No signs of Nausea or vomiting  Post-op Vital Signs: stable  Complications: No apparent anesthesia complications

## 2012-04-30 NOTE — Progress Notes (Addendum)
Patient ID: Christina Shepherd, female   DOB: 04-10-1931, 78 y.o.   MRN: [id number] Patient c/o pain at the base of her head and in to her neck after sleeping last night.  The patient is aware that she is scheduled today for a temporal artery biopsy.  She wishes to proceed.  Cuong Moorman E 04/30/2012

## 2012-04-30 NOTE — Progress Notes (Addendum)
[facility] PROGRESS NOTE  ETOSHA WETHERELL EAV:[id number] DOB: Jan 15, 1931 DOA: 04/29/2012 PCP: No primary provider on file.  Assessment/Plan: 1-Left temporal headache: Temporal artery biopsy at has been performed today by [facility], pathology pending at this point. Improvement in her sedimentation rate with the use of prednisone. Patient also reports no blood or vision, no photophobia and improvement in her headache. Will continue his steroids and following ESR train. Will also continue concomitant treatment for sinusitis using loratadine, nasal spray, unasyn.  2-Nausea & vomiting: Resolved. Continue as needed antiemetics.  3-Sinusitis: As mentioned above will continue daily loratadine, saline sprays twice a day, and will use Unasyn through her veins. Steroids that has been initiated at this moment for questionable [facility] will also help her sinusitis condition.   4-HLD (hyperlipidemia): continue statins.   5-Dehydration with hyponatremia: Resolved after IV fluids given. We'll continue when he tries in her electrolytes.  6-Depression: Stable and well controlled. Will continue Prozac and Wellbutrin.   7-Hypothyroidism: TSH elevated; will adjust synthroid dose.   8-Bilateral leg edema: Patient with venous stasis chronically and using when necessary spironolactone and Lasix. Currently just with trace edema bilaterally and in no needs for diuretics. Will continue holding this medications.  9-hypokalemia: Most likely associated with decreased by mouth intake prior to admission and current use of steroids. Will replete and repeat a sick metabolic panel in the morning.  10-dysphagia: Appears to be a chronic problem the height is slowly worsening according to patient's description. Will ask speech therapy to able weight and provide recommendations; will change her diet to dysphagia 3 meanwhile.   GI and DVT prophylaxis: Protonix and SCD's   Code Status: DNR Family Communication: Close family  friend at bedside; plan of care discussed also with patient Disposition Plan: Home when medically stable   Brief narrative: 78 year old female with a past medical history significant for hyperlipidemia, hypothyroidism, depression, and chronic sinusitis; came to the hospital complaining of left side headache for the last 4-5 days. Patient reports that the pain has been gradually worsening and over the last day or 2 associated with some blurred vision and photophobia. Patient was recently seen in the ED secondary to her headache and about moment a CT scan of her head demonstrated pansinusitis patient was started on treatment for her sinusitis but despite the treatment she had had no improvement in her headache. In the ED her sedimentation rate was higher than 50, was having pain to palpation in her temporal area and because of the vision changes [facility] has been called to admit the patient for further evaluation and treatment and to rule out GAC.   Consultants:  [facility]  Procedures:  Temporal artery biopsy  Antibiotics:  Unasyn  HPI/Subjective: Afebrile, patient reports significant improvement in her headache; denies any chest pain or shortness of breath. At this moment she is also no complaining of any blurred vision or photophobia. Patient 89 sedimentation rate is trending down and the patient is just complaining of some dysphagia (chronic and mainly associated with some types of solid food)  Objective: Filed Vitals:   04/30/12 1130 04/30/12 1157 04/30/12 1220 04/30/12 1243  BP: 126/60 137/71 113/72 121/67  Pulse: 62 69 68 65  Temp: 97 F (36.1 C) 97.4 F (36.3 C) 97.5 F (36.4 C) 97.4 F (36.3 C)  TempSrc:   Oral   Resp: 19 16 18 16   Height:      Weight:      SpO2: 92% 96% 98% 96%    Intake/Output  Summary (Last 24 hours) at 04/30/12 1529 Last data filed at 04/30/12 1054  Gross per 24 hour  Intake   2140 ml  Output      0 ml  Net   2140 ml   Filed Weights    04/29/12 0805  Weight: 63.504 kg (140 lb)    Exam:   General:  NAD; denies blurred vision today; left temporal area with a fresh wound from temporal artery biopsy.  Cardiovascular: RRR, no rubs or gallops  Respiratory: CTA bilaterally  Abdomen: soft, NT, ND; positive BS  Extremities: No swelling, no erythema, no cyanosis. SCD's in place  Neurology: Alert, awake and oriented x3; creatinine grossly intact, no focal or sensory deficit appreciated.  Data Reviewed: Basic Metabolic Panel:  Lab 04/30/12 1191 04/29/12 0400 04/27/12 1045  NA 138 133* 136  K 3.4* 3.7 4.1  CL 107 99 100  CO2 23 22 25   GLUCOSE 109* 120* 122*  BUN 12 13 15   CREATININE 0.64 0.59 0.72  CALCIUM 8.7 9.4 9.4  MG -- -- --  PHOS -- -- --   Liver Function Tests:  Lab 04/29/12 0450  AST 30  ALT 26  ALKPHOS 120*  BILITOT 0.2*  PROT 6.7  ALBUMIN 3.2*   CBC:  Lab 04/30/12 0505 04/29/12 0400 04/27/12 1045  WBC 10.1 9.8 10.4  NEUTROABS -- -- 8.3*  HGB 11.3* 12.5 12.6  HCT 33.3* 37.5 37.1  MCV 86.5 86.2 87.3  PLT 281 270 243     Recent Results (from the past 240 hour(s))  SURGICAL PCR SCREEN     Status: Normal   Collection Time   04/29/12 11:29 PM      Component Value Range Status Comment   MRSA, PCR NEGATIVE  NEGATIVE Final    Staphylococcus aureus NEGATIVE  NEGATIVE Final      Studies: Ct Head Wo Contrast  04/27/2012  *RADIOLOGY REPORT*  Clinical Data: Headache, history of paranasal sinus disease  CT HEAD WITHOUT CONTRAST  Technique:  Contiguous axial images were obtained from the base of the skull through the vertex without contrast.  Comparison: MR brain of 08/01/2011 and CT brain of 03/04/2008  Findings: The ventricular system is prominent as are the cortical sulci, indicative of diffuse atrophy.  The septum remains midline. There has been some progression of moderate small vessel ischemic change throughout the periventricular white matter.  No hemorrhage, mass lesion, or acute infarction  is seen.  There is complete opacification of sphenoid sinus with opacification of multiple ethmoid air cells and probably of the right maxillary sinus.  These findings are consistent with severe pansinusitis.  All of the paranasal sinuses are not evaluated on this exam.  The mastoid air cells appear pneumatized.  No calvarial abnormality is seen.  IMPRESSION:  1.  Some progression of small vessel ischemic change.  No acute intracranial abnormality. 2.  Severe pansinusitis.  Original Report Authenticated By: Juline Patch, M.D.    Scheduled Meds:   . sodium chloride   Intravenous STAT  . ampicillin-sulbactam (UNASYN) IV  1.5 g Intravenous Q6H  . atorvastatin  40 mg Oral q1800  . buPROPion  300 mg Oral Daily  . FLUoxetine  20 mg Oral Daily  . levothyroxine  112 mcg Oral QAC breakfast  . loratadine  10 mg Oral Daily  . multivitamin  1 tablet Oral Daily  . multivitamin with minerals  1 tablet Oral Daily  . pantoprazole  40 mg Oral Q1200  . potassium chloride  40 mEq Oral Q4H  . predniSONE  60 mg Oral Q breakfast  . sodium chloride  2 spray Each Nare Q12H   Continuous Infusions:   . sodium chloride 75 mL/hr at 04/30/12 0458  . dextrose 5 % and 0.45 % NaCl with KCl 20 mEq/L    . lactated ringers    . DISCONTD: lactated ringers 1,000 mL (04/30/12 0924)    Time spent: > 30 minutes    Antony Sian  [facility] Pager [phone]. If 8PM-8AM, please contact night-coverage at [email or web address], password [facility] 04/30/2012, 3:29 PM  LOS: 1 day

## 2012-04-30 NOTE — Preoperative (Signed)
Beta Blockers   Reason not to administer Beta Blockers:Not Applicable, not on home BB 

## 2012-04-30 NOTE — Anesthesia Procedure Notes (Signed)
Procedure Name: LMA Insertion Date/Time: 04/30/2012 10:09 AM Performed by: Randon Goldsmith [name] Pre-anesthesia Checklist: Patient identified, Emergency Drugs available, Suction available and Patient being monitored Patient Re-evaluated:Patient Re-evaluated prior to inductionOxygen Delivery Method: Circle system utilized Preoxygenation: Pre-oxygenation with 100% oxygen Intubation Type: IV induction Ventilation: Mask ventilation without difficulty LMA: LMA inserted LMA Size: 4.0 Tube type: Oral Number of attempts: 1 Tube secured with: Tape Dental Injury: Teeth and Oropharynx as per pre-operative assessment

## 2012-04-30 NOTE — Progress Notes (Signed)
INITIAL ADULT NUTRITION ASSESSMENT Date: 04/30/2012   Time: 4:01 PM Reason for Assessment: Nutrition risk  INTERVENTION: Diet per SLP/MD. Will monitor.   ASSESSMENT: Female 78 y.o.  Dx: Left temporal headache  Food/Nutrition Related Hx: Pt reports being a "grazer" of food her whole life, mostly eating snacks throughout the day, not a big eater. Pt reports that if she would try to eat a big meal PTA she would vomit it up. Pt reports her weight has been stable. Pt c/o dysphagia with solid foods and states that if she tries to eat something with a slimy texture, she will vomit it up. Pt states she avoids coarse food and has to chop up meats very finely. Pt states she has had this problem swallowing solid foods for her entire life. Pt states she thinks she may have an esophageal stricture and needs to have her esophagus stretched.   Hx:  Past Medical History  Diagnosis Date  . Elevated cholesterol    Related Meds:  Scheduled Meds:   . sodium chloride   Intravenous STAT  . ampicillin-sulbactam (UNASYN) IV  1.5 g Intravenous Q6H  . atorvastatin  40 mg Oral q1800  . buPROPion  300 mg Oral Daily  . FLUoxetine  20 mg Oral Daily  . levothyroxine  112 mcg Oral QAC breakfast  . loratadine  10 mg Oral Daily  . multivitamin  1 tablet Oral Daily  . multivitamin with minerals  1 tablet Oral Daily  . pantoprazole  40 mg Oral Q1200  . potassium chloride  40 mEq Oral Q4H  . predniSONE  60 mg Oral Q breakfast  . sodium chloride  2 spray Each Nare Q12H   Continuous Infusions:   . sodium chloride 75 mL/hr at 04/30/12 0458  . dextrose 5 % and 0.45 % NaCl with KCl 20 mEq/L    . lactated ringers    . DISCONTD: lactated ringers 1,000 mL (04/30/12 0924)   PRN Meds:.acetaminophen, acetaminophen, acetaminophen, fentaNYL, HYDROcodone-acetaminophen, HYDROcodone-acetaminophen, meperidine (DEMEROL) injection, morphine injection, ondansetron (ZOFRAN) IV, ondansetron (ZOFRAN) IV, ondansetron, ondansetron,  promethazine, zolpidem, DISCONTD: 0.9 % irrigation (POUR BTL), DISCONTD: bupivacaine  Ht: 5\' 2"  (157.5 cm)  Wt: 140 lb (63.504 kg)  Ideal Wt: 110 lb % Ideal Wt: 127  Usual Wt: 140 lb % Usual Wt: 100  Body mass index is 25.61 kg/(m^2).  Labs:  CMP     Component Value Date/Time   NA 138 04/30/2012 0505   K 3.4* 04/30/2012 0505   CL 107 04/30/2012 0505   CO2 23 04/30/2012 0505   GLUCOSE 109* 04/30/2012 0505   BUN 12 04/30/2012 0505   CREATININE 0.64 04/30/2012 0505   CALCIUM 8.7 04/30/2012 0505   PROT 6.7 04/29/2012 0450   ALBUMIN 3.2* 04/29/2012 0450   AST 30 04/29/2012 0450   ALT 26 04/29/2012 0450   ALKPHOS 120* 04/29/2012 0450   BILITOT 0.2* 04/29/2012 0450   GFRNONAA 83* 04/30/2012 0505   GFRAA >90 04/30/2012 0505    Intake/Output Summary (Last 24 hours) at 04/30/12 1606 Last data filed at 04/30/12 1054  Gross per 24 hour  Intake   2140 ml  Output      0 ml  Net   2140 ml   Last BM - 04/28/12  Diet Order: Dysphagia 3, thin liquid   IVF:    sodium chloride Last Rate: 75 mL/hr at 04/30/12 0458  dextrose 5 % and 0.45 % NaCl with KCl 20 mEq/L   lactated ringers   DISCONTD: lactated  ringers Last Rate: 1,000 mL (04/30/12 0924)    Estimated Nutritional Needs:   Kcal:1450-1650 Protein:65-75g Fluid:1.4-1.6L  NUTRITION DIAGNOSIS: -Swallowing difficulty (NI-1.1).  Status: Ongoing  RELATED TO: unknown origin  AS EVIDENCE BY: pt statement  MONITORING/EVALUATION(Goals): Pt able to safely consume >75% of meals.   EDUCATION NEEDS: -No education needs identified at this time  Dietitian #: [phone]  DOCUMENTATION CODES Per approved criteria  -Not Applicable    Marshall Cork 04/30/2012, 4:01 PM

## 2012-05-01 ENCOUNTER — Encounter (HOSPITAL_COMMUNITY): Payer: Self-pay | Admitting: Surgery

## 2012-05-01 ENCOUNTER — Inpatient Hospital Stay (HOSPITAL_COMMUNITY): Payer: Medicare Other

## 2012-05-01 DIAGNOSIS — R112 Nausea with vomiting, unspecified: Secondary | ICD-10-CM

## 2012-05-01 LAB — BASIC METABOLIC PANEL
BUN: 10 mg/dL (ref 6–23)
CO2: 23 mEq/L (ref 19–32)
Calcium: 9 mg/dL (ref 8.4–10.5)
GFR calc non Af Amer: 84 mL/min — ABNORMAL LOW (ref >90)
Glucose, Bld: 146 mg/dL — ABNORMAL HIGH (ref 70–99)
Potassium: 4.1 mEq/L (ref 3.5–5.1)

## 2012-05-01 LAB — SEDIMENTATION RATE: Sed Rate: 44 mm/hr — ABNORMAL HIGH (ref 0–22)

## 2012-05-01 MED ORDER — SALINE SPRAY 0.65 % NA SOLN: 2.0000 | Freq: Two times a day (BID) | NASAL | Status: DC

## 2012-05-01 MED ORDER — LEVOTHYROXINE SODIUM 125 MCG PO TABS: 125.0000 ug | ORAL_TABLET | Freq: Every day | ORAL | Status: AC

## 2012-05-01 MED ORDER — TRAMADOL HCL 50 MG PO TABS: 50.0000 mg | ORAL_TABLET | Freq: Three times a day (TID) | ORAL | Status: DC | PRN

## 2012-05-01 MED ORDER — AMOXICILLIN 500 MG PO CAPS: 500.0000 mg | ORAL_CAPSULE | Freq: Three times a day (TID) | ORAL | Status: AC

## 2012-05-01 MED ORDER — PANTOPRAZOLE SODIUM 40 MG PO TBEC: 40.0000 mg | DELAYED_RELEASE_TABLET | Freq: Every day | ORAL | Status: DC

## 2012-05-01 MED ORDER — CYCLOBENZAPRINE HCL 5 MG PO TABS: 5.0000 mg | ORAL_TABLET | Freq: Every evening | ORAL | Status: AC | PRN

## 2012-05-01 MED ORDER — LORATADINE 10 MG PO TABS: 10.0000 mg | ORAL_TABLET | Freq: Every day | ORAL | Status: DC

## 2012-05-01 MED ORDER — PREDNISONE 20 MG PO TABS: ORAL_TABLET | ORAL | Status: DC

## 2012-05-01 MED ORDER — PROMETHAZINE HCL 12.5 MG PO TABS
12.5000 mg | ORAL_TABLET | Freq: Four times a day (QID) | ORAL | Status: DC | PRN
Start: 2012-05-01 — End: 2015-07-23

## 2012-05-01 NOTE — Progress Notes (Signed)
Patient ID: Christina Shepherd, female   DOB: 15-Apr-1931, 78 y.o.   MRN: [id number] 1 Day Post-Op  Subjective: Pt feels ok.  Wanting to go home.  Objective: Vital signs in last 24 hours: Temp:  [96.8 F (36 C)-98.1 F (36.7 C)] 98.1 F (36.7 C) (08/20 0550) Pulse Rate:  [62-92] 87  (08/20 0550) Resp:  [13-19] 18  (08/20 0550) BP: (113-148)/(45-77) 126/51 mmHg (08/20 0550) SpO2:  [92 %-100 %] 96 % (08/20 0550) Last BM Date: 04/30/12  Intake/Output from previous day: 08/19 0701 - 08/20 0700 In: 2309.2 [P.O.:480; I.V.:1629.2; IV Piggyback:200] Out: -  Intake/Output this shift:    PE: Head: incision is c/d/i with dermabond present  Lab Results:   Basename 04/30/12 0505 04/29/12 0400  WBC 10.1 9.8  HGB 11.3* 12.5  HCT 33.3* 37.5  PLT 281 270   BMET  Basename 05/01/12 0515 04/30/12 0505  NA 138 138  K 4.1 3.4*  CL 106 107  CO2 23 23  GLUCOSE 146* 109*  BUN 10 12  CREATININE 0.62 0.64  CALCIUM 9.0 8.7   PT/INR No results found for this basename: LABPROT:2,INR:2 in the last 72 hours CMP     Component Value Date/Time   NA 138 05/01/2012 0515   K 4.1 05/01/2012 0515   CL 106 05/01/2012 0515   CO2 23 05/01/2012 0515   GLUCOSE 146* 05/01/2012 0515   BUN 10 05/01/2012 0515   CREATININE 0.62 05/01/2012 0515   CALCIUM 9.0 05/01/2012 0515   PROT 6.7 04/29/2012 0450   ALBUMIN 3.2* 04/29/2012 0450   AST 30 04/29/2012 0450   ALT 26 04/29/2012 0450   ALKPHOS 120* 04/29/2012 0450   BILITOT 0.2* 04/29/2012 0450   GFRNONAA 84* 05/01/2012 0515   GFRAA >90 05/01/2012 0515   Lipase  No results found for this basename: lipase       Studies/Results: No results found.  Anti-infectives: Anti-infectives     Start     Dose/Rate Route Frequency Ordered Stop   04/29/12 1600   ampicillin-sulbactam (UNASYN) 1.5 g in sodium chloride 0.9 % 50 mL IVPB        1.5 g 100 mL/hr over 30 Minutes Intravenous Every 6 hours 04/29/12 0820     04/29/12 0830   ampicillin-sulbactam (UNASYN) 1.5 g in  sodium chloride 0.9 % 50 mL IVPB        1.5 g 100 mL/hr over 30 Minutes Intravenous NOW 04/29/12 0820 04/29/12 0945           Assessment/Plan  1. S/p TA biopsy  Plan: 1. Wound looks good.  Ok for [employer] home from our standpoint.  She may follow up prn.   LOS: 2 days    Christina Shepherd E 05/01/2012

## 2012-05-01 NOTE — Discharge Summary (Signed)
Physician Discharge Summary  [facility] ZOX:[id number] DOB: 18-Dec-1930 DOA: 04/29/2012  PCP: No primary provider on file.  Admit date: 04/29/2012 Discharge date: 05/01/2012  Recommendations for Outpatient Follow-up:  1. Follow with PCP (in 2 weeks; check sinusitis symptoms improvement and if needed referred to ENT for further evaluation and treatment; follow also symptoms of dysphagia and if needed arranged GI follow up for EGD, consider also low dose of CCB. Recheck TSH and free T4 in 4 weeks. HHRN arranged for assistance with medication compliance and arranging pill box meds.) 2. Follow with [facility] (Dr.Ginker) to assess temporal biopsy wound in 10 days.  Discharge Diagnoses:  Principal Problem:  *Left temporal headache Active Problems:  Nausea & vomiting  Sinusitis  HLD (hyperlipidemia)  Dehydration with hyponatremia  Depression  Hypothyroidism  Bilateral leg edema  Dysphagia  Hypokalemia   Discharge Condition: stable and improved. HHRN arranged to help with medication compliance and patient advised to follow with PCP in 2 weeks for further evaluation and treatment.  Diet recommendation: Regular;Thin liquid (chop/ground meats)  Medication Administration: Whole meds with liquid  Compensations: Slow rate;Small sips/bites;Follow solids with liquid  Postural Changes and/or Swallow Maneuvers: Seated upright 90 degrees;Upright 30-60 min after meal    Filed Weights   04/29/12 0805  Weight: 63.504 kg (140 lb)    History of present illness:  78 year old female with a past medical history significant for hyperlipidemia, hypothyroidism, depression, and chronic sinusitis; came to the hospital complaining of left side headache for the last 4-5 days. Patient reports that the pain has been gradually worsening and over the last day or 2 associated with some blurred vision and photophobia. Patient was recently seen in the ED secondary to her headache and about moment a CT scan of her head  demonstrated pansinusitis patient was started on treatment for her sinusitis but despite the treatment she had had no improvement in her headache. In the ED her sedimentation rate was higher than 50, was having pain to palpation in her temporal area and because of the vision changes [facility] has been called to admit the patient for further evaluation and treatment and to rule out GAC.   Hospital Course:  1-Left temporal headache: Temporal artery biopsy at has been performed and result negative for GAC. At this point HA and elevation on her ESR most likely associated with sinusitis. Will taper steroids off, continue antibiotics and also claritin/nasal spray. Patient will follow with PCP for further evaluation and treatment and will benefit of ENT referral if symptoms persists.  2-Nausea & vomiting: Resolved. Continue as needed antiemetics and also PPI.   3-Sinusitis: As mentioned above will continue daily loratadine, saline sprays twice a day, and will use PO amoxcicillin for 8 more days to complete abx therapy. Follow up with PCP in 2 weeks. Steroids will be tapered off over 8 days. ENT referral as an outpatient if symptoms persist.   4-HLD (hyperlipidemia): continue statins.   5-Dehydration with hyponatremia: Resolved after IV fluids given. Patient encouraged to keep herself well hydrated.   6-Depression: Stable and well controlled. Will continue Prozac and Wellbutrin.   7-Hypothyroidism: TSH elevated at 8.84. Will increase synthroid to daily and patient advised to take it on empty stomach and 30 minutes away from other meds. TSH and free T 4 to be recheck in 4 weeks by PCP.  8-Bilateral leg edema: Patient with venous stasis chronically and using when necessary spironolactone and Lasix. Currently just with trace edema bilaterally and in no needs  for diuretics.   9-hypokalemia: Most likely associated with decreased by mouth intake prior to admission and use of steroids. Repleted  and WNL at discharge.  10-Dysphagia: 2/2 to dysmotility as per demonstrated on esophagogram; also due to reflux. Will discharge on a PPI and will provide instructions from speech therapy to help with symptoms. Patient to follow with PCP for this problem, if persist recommend EGD and probably use of low dose CCB.   Procedures:  Esophagogram  Temporal artery biopsy (pathology results negative for TA)  Consultations:  [facility]  Speech Therapy  Discharge Exam: Filed Vitals:   05/01/12 1404  BP: 116/94  Pulse: 98  Temp: 99.1 F (37.3 C)  Resp: 20   Filed Vitals:   04/30/12 1500 04/30/12 2127 05/01/12 0550 05/01/12 1404  BP: 124/62 131/56 126/51 116/94  Pulse: 67 92 87 98  Temp: 97.8 F (36.6 C) 98.1 F (36.7 C) 98.1 F (36.7 C) 99.1 F (37.3 C)  TempSrc: Oral Oral Oral Oral  Resp: 16 18 18 20   Height:      Weight:      SpO2: 99% 99% 96% 94%    General: NAD, no HA's; no N/V and tolerating full diet Cardiovascular: RRR, No rubs or gallops Respiratory: CTA bilaterally Abdomen: soft, NT, ND, positive BS Extremities:trace edema bilaterally, no cyanosis or clubbing Neurology: AAOX3; no CN deficit; no motor or sensory deficit.  Discharge Instructions  Discharge Orders    Future Orders Please Complete By Expires   Diet - low sodium heart healthy      Discharge instructions      Comments:   -warm water before meals and after meals to help with esophageal dysmotility -Arrange visit with PCP in 2 weeks -Take medications as prescribed -Thyroid medication to be taken on empty stomach and 30-40 minutes away from other medications -Follow [facility] recommendations and instructions -Arrange visit with ophthalmology to have your vision recheck     Medication List  As of 05/01/2012  5:13 PM   STOP taking these medications         moxifloxacin 400 MG tablet      naproxen sodium 220 MG tablet         TAKE these medications         amoxicillin 500 MG capsule   Commonly known  as: AMOXIL   Take 1 capsule (500 mg total) by mouth 3 (three) times daily.      buPROPion 300 MG 24 hr tablet   Commonly known as: WELLBUTRIN XL   Take 300 mg by mouth daily.      cyclobenzaprine 5 MG tablet   Commonly known as: FLEXERIL   Take 1 tablet (5 mg total) by mouth at bedtime as needed for muscle spasms (and headaches).      FLUoxetine 20 MG tablet   Commonly known as: PROZAC   Take 20 mg by mouth daily.      furosemide 40 MG tablet   Commonly known as: LASIX   Take 40 mg by mouth daily as needed. For swelling      levothyroxine 125 MCG tablet   Commonly known as: SYNTHROID, LEVOTHROID   Take 1 tablet (125 mcg total) by mouth daily. On empty stomach      loratadine 10 MG tablet   Commonly known as: CLARITIN   Take 1 tablet (10 mg total) by mouth daily.      OCUVITE PRESERVISION PO   Take 1 capsule by mouth 2 (two) times daily.  multivitamins ther. w/minerals Tabs   Take 1 tablet by mouth daily.      pantoprazole 40 MG tablet   Commonly known as: PROTONIX   Take 1 tablet (40 mg total) by mouth daily at 12 noon.      predniSONE 20 MG tablet   Commonly known as: DELTASONE   Take 3 tabs by mouth X 2 days, then 2 tabs by mouth X 2 days, then 1 tab by mouth X 2 days; then 1/2 tab by mouth x 2 days and stop.      promethazine 12.5 MG tablet   Commonly known as: PHENERGAN   Take 1 tablet (12.5 mg total) by mouth every 6 (six) hours as needed for nausea.      rosuvastatin 20 MG tablet   Commonly known as: CRESTOR   Take 20 mg by mouth daily.      sodium chloride 0.65 % Soln nasal spray   Commonly known as: OCEAN   Place 2 sprays into the nose every 12 (twelve) hours.      spironolactone 25 MG tablet   Commonly known as: ALDACTONE   Take 25 mg by mouth 2 (two) times daily as needed. For leg edema      traMADol 50 MG tablet   Commonly known as: ULTRAM   Take 1 tablet (50 mg total) by mouth every 8 (eight) hours as needed (, not relieved by tylenol). For  pain. Maximum dose= 8 tablets per day      zolpidem 5 MG tablet   Commonly known as: AMBIEN   Take 5 mg by mouth at bedtime.           Follow-up Information    Follow up with Velora Heckler, MD. (As needed)    Contact information:   [address] [place] [address] [phone]           The results of significant diagnostics from this hospitalization (including imaging, microbiology, ancillary and laboratory) are listed below for reference.    Significant Diagnostic Studies: Ct Head Wo Contrast  04/27/2012  *RADIOLOGY REPORT*  Clinical Data: Headache, history of paranasal sinus disease  CT HEAD WITHOUT CONTRAST  Technique:  Contiguous axial images were obtained from the base of the skull through the vertex without contrast.  Comparison: MR brain of 08/01/2011 and CT brain of 03/04/2008  Findings: The ventricular system is prominent as are the cortical sulci, indicative of diffuse atrophy.  The septum remains midline. There has been some progression of moderate small vessel ischemic change throughout the periventricular white matter.  No hemorrhage, mass lesion, or acute infarction is seen.  There is complete opacification of sphenoid sinus with opacification of multiple ethmoid air cells and probably of the right maxillary sinus.  These findings are consistent with severe pansinusitis.  All of the paranasal sinuses are not evaluated on this exam.  The mastoid air cells appear pneumatized.  No calvarial abnormality is seen.  IMPRESSION:  1.  Some progression of small vessel ischemic change.  No acute intracranial abnormality. 2.  Severe pansinusitis.  Original Report Authenticated By: Juline Patch, M.D.   Dg Esophagus  05/01/2012  *RADIOLOGY REPORT*  Clinical Data: Dysphagia, vomiting.  ESOPHOGRAM/BARIUM SWALLOW  Technique:  Single contrast examination was performed using thin barium.  Fluoroscopy time:  2.4 minutes.  Comparison:  None.  Findings:  Fluoroscopic  evaluation of swallowing demonstrates disruption of primary esophageal waves (2/4), proximal escape, contrast stasis within the esophagus and  tertiary contractions, particularly when swallowing several swallows rapidly.  No fixed stricture, fold thickening or mass.  The patient swallowed a 13 mm barium tablet which freely passed into the stomach and did not reproduce the patient's symptoms.  IMPRESSION: Dysmotility as described above.  Esophageal spasm noted particularly with rapid swallows.  This seemed to reproduce the patient's symptoms.  No fixed stricture.   Original Report Authenticated By: Cyndie Chime, M.D.     Microbiology: Recent Results (from the past 240 hour(s))  SURGICAL PCR SCREEN     Status: Normal   Collection Time   04/29/12 11:29 PM      Component Value Range Status Comment   MRSA, PCR NEGATIVE  NEGATIVE Final    Staphylococcus aureus NEGATIVE  NEGATIVE Final      Labs: Basic Metabolic Panel:  Lab 05/01/12 1610 04/30/12 0505 04/29/12 0400 04/27/12 1045  NA 138 138 133* 136  K 4.1 3.4* 3.7 4.1  CL 106 107 99 100  CO2 23 23 22 25   GLUCOSE 146* 109* 120* 122*  BUN 10 12 13 15   CREATININE 0.62 0.64 0.59 0.72  CALCIUM 9.0 8.7 9.4 9.4  MG 1.8 -- -- --  PHOS -- -- -- --   Liver Function Tests:  Lab 04/29/12 0450  AST 30  ALT 26  ALKPHOS 120*  BILITOT 0.2*  PROT 6.7  ALBUMIN 3.2*   CBC:  Lab 04/30/12 0505 04/29/12 0400 04/27/12 1045  WBC 10.1 9.8 10.4  NEUTROABS -- -- 8.3*  HGB 11.3* 12.5 12.6  HCT 33.3* 37.5 37.1  MCV 86.5 86.2 87.3  PLT 281 270 243    Time coordinating discharge: 30 minutes  Signed:  Brenlynn Fake  [facility] 05/01/2012, 5:13 PM

## 2012-05-01 NOTE — Progress Notes (Signed)
General Surgery [facility] As above.  Per medical service.  Discussed with Dr. Gwenlyn Perking.  Will sign off. Velora Heckler, MD, [facility] Office: [phone]

## 2012-05-01 NOTE — Progress Notes (Signed)
Patient discharged home in stable condition.  Discharge instructions given to patient and family member with verbal understanding and feedback. Personal items accounted for and given to patient upon discharge
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
## 2012-05-01 NOTE — Progress Notes (Signed)
ANTIBIOTIC CONSULT NOTE - Follow-up  Pharmacy Consult for Unasyn Indication: Pansinusitis  No Known Allergies  Patient Measurements: Height: 5\' 2"  (157.5 cm) Weight: 140 lb (63.504 kg) IBW/kg (Calculated) : 50.1   Vital Signs: Temp: 98.1 F (36.7 C) (08/20 0550) Temp src: Oral (08/20 0550) BP: 126/51 mmHg (08/20 0550) Pulse Rate: 87  (08/20 0550) Intake/Output from previous day: 08/19 0701 - 08/20 0700 In: 2309.2 [P.O.:480; I.V.:1629.2; IV Piggyback:200] Out: -  Intake/Output from this shift:    Labs:  Basename 05/01/12 0515 04/30/12 0505 04/29/12 0400  WBC -- 10.1 9.8  HGB -- 11.3* 12.5  PLT -- 281 270  LABCREA -- -- --  CREATININE 0.62 0.64 0.59   Estimated Creatinine Clearance: 50 ml/min (by C-G formula based on Cr of 0.62). No results found for this basename: VANCOTROUGH:2,VANCOPEAK:2,VANCORANDOM:2,GENTTROUGH:2,GENTPEAK:2,GENTRANDOM:2,TOBRATROUGH:2,TOBRAPEAK:2,TOBRARND:2,AMIKACINPEAK:2,AMIKACINTROU:2,AMIKACIN:2, in the last 72 hours   Microbiology: Recent Results (from the past 720 hour(s))  SURGICAL PCR SCREEN     Status: Normal   Collection Time   04/29/12 11:29 PM      Component Value Range Status Comment   MRSA, PCR NEGATIVE  NEGATIVE Final    Staphylococcus aureus NEGATIVE  NEGATIVE Final     Medical History: Past Medical History  Diagnosis Date  . Elevated cholesterol     Medications:  Anti-infectives     Start     Dose/Rate Route Frequency Ordered Stop   04/29/12 1600   ampicillin-sulbactam (UNASYN) 1.5 g in sodium chloride 0.9 % 50 mL IVPB        1.5 g 100 mL/hr over 30 Minutes Intravenous Every 6 hours 04/29/12 0820     04/29/12 0830   ampicillin-sulbactam (UNASYN) 1.5 g in sodium chloride 0.9 % 50 mL IVPB        1.5 g 100 mL/hr over 30 Minutes Intravenous NOW 04/29/12 0820 04/29/12 0945         Assessment: 78 yo F  started Unasyn per Rx for pansinusitis. Was seen in ER on 8/16 and started on moxifloxacin; however, pt is unable to  tolerate this (N/V). She is s/p temporal artery bx to r/o temporal arteritis  Plan:  1) Contrinue Unasyn 1.5g IV q6h D#2, no cultures available.   Christina Shepherd, PharmD, BCPS.   Pager: [phone] 05/01/2012,11:11 AM

## 2012-05-01 NOTE — Op Note (Signed)
Christina Shepherd, Christina Shepherd              ACCOUNT NO.:  [id number]  MEDICAL RECORD NO.:  [id number]  LOCATION:  1516                         FACILITY:  [facility]  PHYSICIAN:  Velora Heckler, MD      DATE OF BIRTH:  Dec 19, 1930  DATE OF PROCEDURE:  04/30/2012                               OPERATIVE REPORT   PREOPERATIVE DIAGNOSIS:  Rule out temporal arteritis.  POSTOPERATIVE DIAGNOSIS:  Rule out temporal arteritis.  PROCEDURE:  Left temporal artery excisional biopsy.  SURGEON:  Velora Heckler, MD, FACS  ANESTHESIA:  General per Phillips Grout, MD  ESTIMATED BLOOD LOSS:  Minimal.  PREPARATION:  ChloraPrep.  COMPLICATIONS:  None.  INDICATIONS:  The patient is a 78 year old, white female admitted to the Medical Service with left-sided headaches and changes in left eye vision.  Medical Service desires temporal artery biopsies to rule out temporal arteritis.  BODY OF REPORT:  Procedure was done in OR #6 at the [facility].  The patient was brought to the operating room, placed in supine position on the operating room table.  Following administration of general anesthesia, the patient was positioned and the left pre-auricular face was prepped and draped in the usual strict aseptic fashion.  After ascertaining that an adequate level of anesthesia had been achieved, 2-cm incision is made anterior to the pinna.  Dissection was carried through subcutaneous tissues.  By palpation, the temporal artery was identified.  It was dissected out. It was divided proximally and distally between small Ligaclips.  An approximate 1.5-cm segment of artery was excised.  This was submitted fresh in saline to pathology.  Pathology was notified of the specimen.  Good hemostasis was achieved.  Subcutaneous tissues were closed with interrupted 3-0 Vicryl sutures.  Skin was anesthetized with local Marcaine circumferentially.  Skin edges were reapproximated with a running 4-0 Monocryl  subcuticular suture.  Wound was washed and dried and Dermabond was applied as dressing.  The patient was awakened from anesthesia and brought to the recovery room.  The patient tolerated the procedure well.  Velora Heckler, MD, [facility] Office: [phone]    TMG/MEDQ  D:  04/30/2012  T:  05/01/2012  Job:  [id number]  cc:   Soyla Murphy. Renne Crigler, M.D. Fax: [phone]

## 2014-02-18 ENCOUNTER — Other Ambulatory Visit: Payer: Self-pay | Admitting: Internal Medicine

## 2014-02-18 DIAGNOSIS — R413 Other amnesia: Secondary | ICD-10-CM

## 2014-03-18 ENCOUNTER — Other Ambulatory Visit: Payer: Medicare Other

## 2014-11-07 ENCOUNTER — Ambulatory Visit (INDEPENDENT_AMBULATORY_CARE_PROVIDER_SITE_OTHER): Payer: Commercial Managed Care - HMO

## 2014-11-07 ENCOUNTER — Ambulatory Visit (INDEPENDENT_AMBULATORY_CARE_PROVIDER_SITE_OTHER): Payer: Commercial Managed Care - HMO | Admitting: Family Medicine

## 2014-11-07 VITALS — BP 130/78 | HR 69 | Temp 97.8°F | Resp 16 | Ht 60.25 in | Wt 165.0 lb

## 2014-11-07 DIAGNOSIS — R05 Cough: Secondary | ICD-10-CM

## 2014-11-07 DIAGNOSIS — R059 Cough, unspecified: Secondary | ICD-10-CM

## 2014-11-07 NOTE — Progress Notes (Signed)
[facility] [address], [place] [id number] [phone] [phone]  Date:  11/07/2014   Name:  Christina Shepherd   DOB:  Nov 03, 1930   MRN:  [id number]  PCP:  Londell MohPHARR,WALTER DAVIDSON, MD    Chief Complaint: Fever and Lung consern   History of Present Illness:  Christina Shepherd is a 81 y.o. very pleasant female patient who presents with the following:  Here today as a new patient.  She is here today with her case- worker She is a pt of Dr. Renne CriglerPharr whom she saw earlier this week.  A home health nurse came out to see her today and she was concerned about her lung exam.  The nurse wanted her to get an x-ray to check on her lungs.  She is a general pt of Dr. Renne CriglerPharr,  She is here today with her social worker, who is with [facility].   She lives with her 4 cats, and feels that she is very happy She does not have any family nearby. She does have children but they are middle aged and "doing their own thing," they do not live nearby and are not close  [facility]- she was delivered a zpack on 2/24  Patient Active Problem List   Diagnosis Date Noted  . Dysphagia 04/30/2012  . Hypokalemia 04/30/2012  . Left temporal headache 04/29/2012  . Nausea & vomiting 04/29/2012  . Sinusitis 04/29/2012  . HLD (hyperlipidemia) 04/29/2012  . Dehydration with hyponatremia 04/29/2012  . Depression 04/29/2012  . Hypothyroidism 04/29/2012  . Bilateral leg edema 04/29/2012    Past Medical History  Diagnosis Date  . Elevated cholesterol   . Allergy   . Anxiety   . Arthritis   . Cataract   . Depression   . Stroke   . Thyroid disease     Past Surgical History  Procedure Laterality Date  . Abdominal hysterectomy    . Knee surgery    . Artery biopsy  04/30/2012    Procedure: BIOPSY TEMPORAL ARTERY;  Surgeon: Velora Hecklerodd M Gerkin, MD;  Location: [facility];  Service: General;  Laterality: Left;  Marland Kitchen. Eye surgery    . Joint replacement      History  Substance Use Topics  . Smoking  status: Former Smoker -- 0.50 packs/day for 30 years    Types: Cigarettes  . Smokeless tobacco: Never Used  . Alcohol Use: 0.6 oz/week    1 Glasses of wine per week    No family history on file.  Allergies  Allergen Reactions  . Penicillins Rash  . Sulfa Antibiotics Rash    Medication list has been reviewed and updated.  Current Outpatient Prescriptions on File Prior to Visit  Medication Sig Dispense Refill  . levothyroxine (SYNTHROID, LEVOTHROID) 125 MCG tablet Take 1 tablet (125 mcg total) by mouth daily. On empty stomach 30 tablet 1  . Multiple Vitamins-Minerals (MULTIVITAMINS THER. W/MINERALS) TABS Take 1 tablet by mouth daily.      Marland Kitchen. buPROPion (WELLBUTRIN XL) 300 MG 24 hr tablet Take 300 mg by mouth daily.      Marland Kitchen. FLUoxetine (PROZAC) 20 MG tablet Take 20 mg by mouth daily.      . furosemide (LASIX) 40 MG tablet Take 40 mg by mouth daily as needed. For swelling     . loratadine (CLARITIN) 10 MG tablet Take 1 tablet (10 mg total) by mouth daily. 30 tablet 1  . Multiple Vitamins-Minerals (OCUVITE PRESERVISION PO) Take 1 capsule  by mouth 2 (two) times daily.      . pantoprazole (PROTONIX) 40 MG tablet Take 1 tablet (40 mg total) by mouth daily at 12 noon. 30 tablet 1  . predniSONE (DELTASONE) 20 MG tablet Take 3 tabs by mouth X 2 days, then 2 tabs by mouth X 2 days, then 1 tab by mouth X 2 days; then 1/2 tab by mouth x 2 days and stop. (Patient not taking: Reported on 11/07/2014) 15 tablet 0  . promethazine (PHENERGAN) 12.5 MG tablet Take 1 tablet (12.5 mg total) by mouth every 6 (six) hours as needed for nausea. 30 tablet 0  . rosuvastatin (CRESTOR) 20 MG tablet Take 20 mg by mouth daily.      . sodium chloride (OCEAN) 0.65 % SOLN nasal spray Place 2 sprays into the nose every 12 (twelve) hours. (Patient not taking: Reported on 11/07/2014) 1 Bottle 1  . spironolactone (ALDACTONE) 25 MG tablet Take 25 mg by mouth 2 (two) times daily as needed. For leg edema     . traMADol (ULTRAM) 50 MG  tablet Take 1 tablet (50 mg total) by mouth every 8 (eight) hours as needed (, not relieved by tylenol). For pain. Maximum dose= 8 tablets per day (Patient not taking: Reported on 11/07/2014) 45 tablet 0  . zolpidem (AMBIEN) 5 MG tablet Take 5 mg by mouth at bedtime.       No current facility-administered medications on file prior to visit.    Review of Systems:  As per HPI- otherwise negative.   Physical Examination: Filed Vitals:   11/07/14 1317  BP: 130/78  Pulse: 69  Temp: 97.8 F (36.6 C)  Resp: 16   Filed Vitals:   11/07/14 1317  Height: 5' 0.25" (1.53 m)  Weight: 165 lb (74.844 kg)   Body mass index is 31.97 kg/(m^2). Ideal Body Weight: Weight in (lb) to have BMI = 25: 128.8  GEN: WDWN, NAD, Non-toxic, A & O x 3, overweight, looks well.  Has some memory deficts HEENT: Atraumatic, Normocephalic. Neck supple. No masses, No LAD.  Bilateral TM wnl, oropharynx normal.  PEERL,EOMI.  Ears and Nose: No external deformity. CV: RRR, No M/G/R. No JVD. No thrill. No extra heart sounds. PULM: CTA B, no wheezes, crackles, rhonchi. No retractions. No resp. distress. No accessory muscle use. ABD: S, NT, ND, +BS. No rebound. No HSM. EXTR: No c/c/e NEURO Normal gait.  PSYCH: Normally interactive. Conversant. Not depressed or anxious appearing.  Calm demeanor.   [facility] reading (PRIMARY) by  Dr. Patsy Lager. CXR:  No acute infiltrate   CHEST 2 VIEW  COMPARISON: Portable chest radiograph 03/04/2008.  FINDINGS: Compared to 2009 she streaky bilateral lower lobe opacity has increased. No prior lateral view for comparison, but on today lateral suspect increased middle and lower lobe streaky peribronchovascular opacity. No pleural effusion. Cardiac size and mediastinal contours are stable and within normal limits. No pneumothorax or pulmonary edema. No acute osseous abnormality identified. Visualized tracheal air column is within normal limits.  IMPRESSION: Compared to 2009 I suspect  acute bilateral middle and lower lobe peribronchovascular opacity, suspicious for acute viral/atypical respiratory infection in this setting.  These results will be called to the ordering clinician or representative by the Radiologist Assistant, and communication documented in the PACS or zVision Dashboard.   The pt's social worker Tamela Oddi pulled me aside and spoke privately.  They are concerned about the state of Zooey's home, it is unsanitary, very dirty with cat feces and "no cat litter"  available for her several cats to use.  She does not have much of a relationship with her children but her son did come in to see her 2 weeks ago and they had a family meeting, they are in the considering starting guardianship proceedings.  They have tried to have adult protective services and the mobile crisis unit step in but so far she is still allowed to live independently.      Assessment and Plan: Cough - Plan: DG Chest 2 View  Called once I received OR report as above.  She may have viral/ atypical after all.  The zpack that she received is a good treatment for this although she may have taken a different dosing schedule.  [facility]irmed with [facility] that she received a standard 6 pills  azithryomcin rx.  Pt reports that she already took all of it- not sure but she likely took 500 mg a day until gone; this is a reasonable treatment for this type of lung infection.  She will let me know if not feeling better and I agreed to check on her next week  Signed Abbe Amsterdam, MD

## 2014-11-07 NOTE — Patient Instructions (Signed)
I think that your chest x-ray looks ok; the radiologist will double check and I will let you know if there is any other problem.

## 2014-11-10 ENCOUNTER — Telehealth: Payer: Self-pay | Admitting: Family Medicine

## 2014-11-10 DIAGNOSIS — J189 Pneumonia, unspecified organism: Secondary | ICD-10-CM

## 2014-11-10 MED ORDER — AZITHROMYCIN 250 MG PO TABS: ORAL_TABLET | ORAL | Status: DC

## 2014-11-10 NOTE — Telephone Encounter (Signed)
Called to check in on her.  She reports that she is overall better but still has some cough.  There is a question of how she took her last zpack and she does have viral vs atypical disease on her recent x-ray.  Will treat with azithromycin- she agrees to carefully follow the package instructions. [facility]ked [facility] to deliver to her

## 2014-11-18 ENCOUNTER — Encounter (HOSPITAL_COMMUNITY): Payer: Self-pay | Admitting: Emergency Medicine

## 2014-11-18 ENCOUNTER — Emergency Department (HOSPITAL_COMMUNITY)
Admission: EM | Admit: 2014-11-18 | Discharge: 2014-11-18 | Disposition: A | Discharge status: Home / Self Care | Payer: Commercial Managed Care - HMO | Attending: Emergency Medicine | Admitting: Emergency Medicine

## 2014-11-18 DIAGNOSIS — Z87891 Personal history of nicotine dependence: Secondary | ICD-10-CM | POA: Insufficient documentation

## 2014-11-18 DIAGNOSIS — M1990 Unspecified osteoarthritis, unspecified site: Secondary | ICD-10-CM | POA: Insufficient documentation

## 2014-11-18 DIAGNOSIS — Z8669 Personal history of other diseases of the nervous system and sense organs: Secondary | ICD-10-CM | POA: Insufficient documentation

## 2014-11-18 DIAGNOSIS — Z792 Long term (current) use of antibiotics: Secondary | ICD-10-CM | POA: Insufficient documentation

## 2014-11-18 DIAGNOSIS — Z8673 Personal history of transient ischemic attack (TIA), and cerebral infarction without residual deficits: Secondary | ICD-10-CM | POA: Insufficient documentation

## 2014-11-18 DIAGNOSIS — F329 Major depressive disorder, single episode, unspecified: Secondary | ICD-10-CM | POA: Diagnosis not present

## 2014-11-18 DIAGNOSIS — Z880 Allergy status to penicillin: Secondary | ICD-10-CM | POA: Diagnosis not present

## 2014-11-18 DIAGNOSIS — Z79899 Other long term (current) drug therapy: Secondary | ICD-10-CM | POA: Diagnosis not present

## 2014-11-18 DIAGNOSIS — R42 Dizziness and giddiness: Secondary | ICD-10-CM | POA: Diagnosis present

## 2014-11-18 DIAGNOSIS — F32A Depression, unspecified: Secondary | ICD-10-CM

## 2014-11-18 DIAGNOSIS — E079 Disorder of thyroid, unspecified: Secondary | ICD-10-CM | POA: Diagnosis not present

## 2014-11-18 DIAGNOSIS — F419 Anxiety disorder, unspecified: Secondary | ICD-10-CM | POA: Diagnosis not present

## 2014-11-18 LAB — CBC WITH DIFFERENTIAL/PLATELET
BASOS PCT: 0 % (ref 0–1)
Basophils Absolute: 0 10*3/uL (ref 0.0–0.1)
Eosinophils Absolute: 0.2 10*3/uL (ref 0.0–0.7)
Eosinophils Relative: 2 % (ref 0–5)
HEMATOCRIT: 43.7 % (ref 36.0–46.0)
HEMOGLOBIN: 14.6 g/dL (ref 12.0–15.0)
Lymphocytes Relative: 32 % (ref 12–46)
Lymphs Abs: 3.1 10*3/uL (ref 0.7–4.0)
MCH: 30.5 pg (ref 26.0–34.0)
MCHC: 33.4 g/dL (ref 30.0–36.0)
MCV: 91.4 fL (ref 78.0–100.0)
MONO ABS: 1.1 10*3/uL — AB (ref 0.1–1.0)
MONOS PCT: 11 % (ref 3–12)
NEUTROS PCT: 55 % (ref 43–77)
Neutro Abs: 5.4 10*3/uL (ref 1.7–7.7)
Platelets: 268 10*3/uL (ref 150–400)
RBC: 4.78 MIL/uL (ref 3.87–5.11)
RDW: 14.3 % (ref 11.5–15.5)
WBC: 9.9 10*3/uL (ref 4.0–10.5)

## 2014-11-18 LAB — URINALYSIS, ROUTINE W REFLEX MICROSCOPIC
Glucose, UA: NEGATIVE mg/dL
Ketones, ur: 15 mg/dL — AB
Nitrite: NEGATIVE
Protein, ur: 30 mg/dL — AB
SPECIFIC GRAVITY, URINE: 1.023 (ref 1.005–1.030)
UROBILINOGEN UA: 1 mg/dL (ref 0.0–1.0)
pH: 6 (ref 5.0–8.0)

## 2014-11-18 LAB — COMPREHENSIVE METABOLIC PANEL
ALBUMIN: 4.5 g/dL (ref 3.5–5.2)
ALK PHOS: 96 U/L (ref 39–117)
ALT: 15 U/L (ref 0–35)
ANION GAP: 9 (ref 5–15)
AST: 20 U/L (ref 0–37)
BILIRUBIN TOTAL: 1 mg/dL (ref 0.3–1.2)
BUN: 20 mg/dL (ref 6–23)
CHLORIDE: 106 mmol/L (ref 96–112)
CO2: 27 mmol/L (ref 19–32)
CREATININE: 0.82 mg/dL (ref 0.50–1.10)
Calcium: 9.8 mg/dL (ref 8.4–10.5)
GFR calc Af Amer: 75 mL/min — ABNORMAL LOW (ref >90)
GFR, EST NON AFRICAN AMERICAN: 65 mL/min — AB (ref >90)
GLUCOSE: 103 mg/dL — AB (ref 70–99)
POTASSIUM: 3 mmol/L — AB (ref 3.5–5.1)
Sodium: 142 mmol/L (ref 135–145)
Total Protein: 7.4 g/dL (ref 6.0–8.3)

## 2014-11-18 LAB — URINE MICROSCOPIC-ADD ON

## 2014-11-18 LAB — RAPID URINE DRUG SCREEN, HOSP PERFORMED
Amphetamines: NOT DETECTED
BARBITURATES: NOT DETECTED
Benzodiazepines: NOT DETECTED
Cocaine: NOT DETECTED
Opiates: NOT DETECTED
TETRAHYDROCANNABINOL: NOT DETECTED

## 2014-11-18 LAB — ETHANOL: Alcohol, Ethyl (B): <5 mg/dL (ref 0–9)

## 2014-11-18 MED ORDER — POTASSIUM CHLORIDE CRYS ER 20 MEQ PO TBCR
40.0000 meq | EXTENDED_RELEASE_TABLET | Freq: Once | ORAL | Status: AC
  Administered 2014-11-18: 40 meq via ORAL
  Filled 2014-11-18: qty 2

## 2014-11-18 NOTE — BH Assessment (Signed)
Reviewed ED notes prior to assessment. [facility] after expressing SI. Per EDP note pt denies SI/HI at this time and appears to be at baseline.   Assessment to commence shortly.    Clista BernhardtNancy Trisha Morandi, [facility] Triage Specialist 11/18/2014 8:37 PM

## 2014-11-18 NOTE — ED Notes (Signed)
Spoke with Harriett SineNancy who is doing TTS to get an estimate of the time it would take to see the pt. Harriett Sineancy states it could take up to two hours before pt is seen. Explained this to the pt and the pt is very irritated and states she is not suicidal and states that it was a "Jewish thing" something she said but did not mean. Pt states she loves her cats too much to be suicidal.

## 2014-11-18 NOTE — BH Assessment (Signed)
Tele Assessment Note   Christina Shepherd is an 81 y.o. female referred to ED by her PCP and home health nurses. Pt reports she was seen by nurses due to recent infection. She was upset today because she needed to get cat food, and her car tag was expired and she could not leave. She reports she made joke about suicide and ended up being sent to ED. She was unhappy at being in ED, stating she did not need to be here and wanted to get home to her cats. Pt denies SI, HI, AV, SA, and self-harm. She reports current stressors being losing independence with age, not wanting to burden other people with her concerns, worries that what she shares with case worker will be told to others, and her relationship with her dtr. Pt reports she feels dtr has a mental illness and "is a bitch of a person." Pt reports she feels dtr tries to ruin good things in her life. Pt reports she dtr tried to ruin her birthday party by being very negative but was escorted out.   Pt reports she deals with depression at times related to living alone, and dealing with her difficult dtr. She reports sometimes she has crying spells and is not hungry. She reports she sleeps well, and loves her 6 cats. She reports she would never hurt herself because it would put her cat at risk.   Pt reports anxiety at times when she feels like she can not drive. She does not like feeling stuck in her home. She reports she does not respond well to being ill at times. She denies hx of trauma or abuse. Denies sx of phobia, OCD, or PTSD. Pt reports she likes to do her normal routines. She enjoys her home and her cats.   Pt reports she feels her dtr is MH, but denies other family hx of MH or SA concerns.   Pt has a PCP, caseworker, son, neighbor, and some friends she can call when she is frustrated or needs assistance. She is able to contract for safety. Educated on resources such as using case worker to help with tags on car, [facility] for transport,  counseling, and reassurance.    Axis I: 311 Unspecified Depressive Disorder  300.00 Unspecified Anxiety Disorder  Rule out Dementia  Axis II: Deferred Axis III:  Past Medical History  Diagnosis Date  . Elevated cholesterol   . Allergy   . Anxiety   . Arthritis   . Cataract   . Depression   . Stroke   . Thyroid disease    Axis IV: other psychosocial or environmental problems, problems with access to health care services and problems with primary support group Axis V: 51-60 moderate symptoms  Past Medical History:  Past Medical History  Diagnosis Date  . Elevated cholesterol   . Allergy   . Anxiety   . Arthritis   . Cataract   . Depression   . Stroke   . Thyroid disease     Past Surgical History  Procedure Laterality Date  . Abdominal hysterectomy    . Knee surgery    . Artery biopsy  04/30/2012    Procedure: BIOPSY TEMPORAL ARTERY;  Surgeon: Velora Heckler, MD;  Location: [facility];  Service: General;  Laterality: Left;  Marland Kitchen Eye surgery    . Joint replacement      Family History: History reviewed. No pertinent family history.  Social History:  reports that she has quit smoking. Her  smoking use included Cigarettes. She has a 15 pack-year smoking history. She has never used smokeless tobacco. She reports that she drinks about 0.6 oz of alcohol per week. She reports that she does not use illicit drugs.  Additional Social History:  Alcohol / Drug Use Pain Medications: SEE PTA Prescriptions: SEE PTA, reports she does not take most of the medication prescribed to her Over the Counter: SEE PTA History of alcohol / drug use?: No history of alcohol / drug abuse (drinks wine on occassion when she has company over, very infrequent) Longest period of sobriety (when/how long): NA Negative Consequences of Use:  (NA) Withdrawal Symptoms:  (NA)  CIWA: CIWA-Ar BP: 104/77 mmHg Pulse Rate: 76 COWS:    PATIENT STRENGTHS: (choose at least two) Ability for insight Active sense of  humor  Allergies:  Allergies  Allergen Reactions  . Penicillins Rash  . Sulfa Antibiotics Rash    Home Medications:  (Not in a hospital admission)  OB/GYN Status:  No LMP recorded. Patient has had a hysterectomy.  General Assessment Data Location of Assessment: [facility] ED Is this a Tele or Face-to-Face Assessment?: Face-to-Face Is this an Initial Assessment or a Re-assessment for this encounter?: Initial Assessment Living Arrangements: Alone Can pt return to current living arrangement?: Yes Admission Status: Voluntary Is patient capable of signing voluntary admission?: Yes Transfer from: Home Referral Source: Other (nurses)     [facility] Living Arrangements: Alone Name of Psychiatrist: sees PCP Dr. Wendy PoetParr Name of Therapist: [facility]  Education Status Is patient currently in school?: No Current Grade: NA Highest grade of school patient has completed: college Name of school: NA Contact person: NA  Risk to self with the past 6 months Suicidal Ideation: No Suicidal Intent: No Is patient at risk for suicide?: No Suicidal Plan?: No Access to Means: No What has been your use of drugs/alcohol within the last 12 months?: drinks wine socially a glass of wine very infrequently  Previous Attempts/Gestures: No How many times?: 0 Other Self Harm Risks: none Triggers for Past Attempts: None known Intentional Self Injurious Behavior: None Family Suicide History: No Recent stressful life event(s): Conflict (Comment) (with dtr) Persecutory voices/beliefs?: No Depression: Yes Depression Symptoms: Tearfulness, Isolating, Feeling angry/irritable, Feeling worthless/self pity Substance abuse history and/or treatment for substance abuse?: No Suicide prevention information given to non-admitted patients: Yes  Risk to Others within the past 6 months Homicidal Ideation: No Thoughts of Harm to Others: No Current Homicidal Intent: No Current  Homicidal Plan: No Access to Homicidal Means: No Identified Victim: none History of harm to others?: No Assessment of Violence: None Noted Violent Behavior Description: none Does patient have access to weapons?: No Criminal Charges Pending?: No Does patient have a court date: No  Psychosis Hallucinations: None noted Delusions: None noted  Mental Status Report Appear/Hygiene: Unremarkable Eye Contact: Good Motor Activity: Unremarkable Speech: Logical/coherent Level of Consciousness: Alert Mood: Irritable, Pleasant Affect: Appropriate to circumstance Anxiety Level: Moderate Thought Processes: Coherent, Relevant Judgement: Partial Orientation: Person, Place, Time, Situation Obsessive Compulsive Thoughts/Behaviors: None  Cognitive Functioning Concentration: Normal Memory: Recent Impaired, Remote Impaired (hx of dementia, impairment appears mild-moderate) IQ: Average Insight: Good Impulse Control: Good Appetite: Fair Weight Loss:  (fluctuates) Weight Gain:  (fluctuates) Sleep: No Change Total Hours of Sleep: 8 Vegetative Symptoms: None  ADLScreening [facility]) Patient's cognitive ability adequate to safely complete daily activities?: Yes Patient able to express need for assistance with ADLs?: Yes Independently performs ADLs?: Yes (  appropriate for developmental age)  Prior Inpatient Therapy Prior Inpatient Therapy: No Prior Therapy Dates: NA Prior Therapy Facilty/Provider(s): NA Reason for Treatment: NA  Prior Outpatient Therapy Prior Outpatient Therapy: Yes Prior Therapy Dates: current Prior Therapy Facilty/Provider(s): [facility] Reason for Treatment: Case management   ADL Screening (condition at time of admission) Patient's cognitive ability adequate to safely complete daily activities?: Yes Is the patient deaf or have difficulty hearing?: No Does the patient have difficulty seeing, even when wearing glasses/contacts?: No Does the  patient have difficulty concentrating, remembering, or making decisions?: Yes Patient able to express need for assistance with ADLs?: Yes Does the patient have difficulty dressing or bathing?: No Independently performs ADLs?: Yes (appropriate for developmental age) Does the patient have difficulty walking or climbing stairs?: No Weakness of Legs: None Weakness of Arms/Hands: None  Home Assistive Devices/Equipment Home Assistive Devices/Equipment: Eyeglasses    Abuse/Neglect Assessment (Assessment to be complete while patient is alone) Physical Abuse: Denies Verbal Abuse: Denies Sexual Abuse: Denies Exploitation of patient/patient's resources: Denies Self-Neglect: Denies Values / Beliefs Cultural Requests During Hospitalization: None Spiritual Requests During Hospitalization: None   Advance Directives (For Healthcare) Does patient have an advance directive?: No Would patient like information on creating an advanced directive?: No - patient declined information    Additional Information 1:1 In Past 12 Months?: No CIRT Risk: No Elopement Risk: No Does patient have medical clearance?: Yes     Disposition:  Per Donell Sievert PA, pt does not meet inpt criteria and can be discharged back her to her Providers. OP resources and education given. Informed Fayrene Helper, PA and he is in agreement. Had pt sign no harm contract.   Christina Shepherd, [facility] Triage Specialist 11/18/2014 9:21 PM  Disposition Initial Assessment Completed for this Encounter: Yes  Christina Shepherd M 11/18/2014 9:20 PM

## 2014-11-18 NOTE — ED Provider Notes (Signed)
CSN: [id number]     Arrival date & time 11/18/14  1739 History   First MD Initiated Contact with Patient 11/18/14 1746     Chief Complaint  Patient presents with  . Suicidal  . Dizziness     (Consider location/radiation/quality/duration/timing/severity/associated sxs/prior Treatment) HPI   81 year old female with history of prior stroke, thyroid disease, depression, anxiety who was brought here via EMS from home for evaluation of suicidal ideation. Patient reports she does have history of depression and is depressed about the relationship between her and her daughter. She does not like her daughter. She reported at her recent birthdate last month her daughter says mean things to offer just and that has greatly disturbed her. When home health nurse come to visit her today she did make a statement about how she wished she was dead even though she does not mean it. She also report having intermittent bouts of dizziness with positional change. Therefore home health nurse contacted EMS to bring patient to the ER for further evaluation. At this time patient denies active suicidal ideation and denies any dizziness or lightheadedness. Patient states she is not upset and she would rather go home. She denies having any severe headache, vision changes, chest pain, shortness of breath, abdominal pain, back pain, dysuria, numbness or weakness. Patient reports she acknowledged her memory is not great but she does not want to be in the ED and reassured that she is not actively suicidal. Patient states she has 6 cats at home she loves very much and she would like to be home with them. She denies any homicidal ideation or hallucination. She denies self medicating with either drugs or alcohol.  Past Medical History  Diagnosis Date  . Elevated cholesterol   . Allergy   . Anxiety   . Arthritis   . Cataract   . Depression   . Stroke   . Thyroid disease    Past Surgical History  Procedure Laterality Date  .  Abdominal hysterectomy    . Knee surgery    . Artery biopsy  04/30/2012    Procedure: BIOPSY TEMPORAL ARTERY;  Surgeon: Velora Heckler, MD;  Location: [facility];  Service: General;  Laterality: Left;  Marland Kitchen Eye surgery    . Joint replacement     History reviewed. No pertinent family history. History  Substance Use Topics  . Smoking status: Former Smoker -- 0.50 packs/day for 30 years    Types: Cigarettes  . Smokeless tobacco: Never Used  . Alcohol Use: 0.6 oz/week    1 Glasses of wine per week   OB History    No data available     Review of Systems  All other systems reviewed and are negative.     Allergies  Penicillins and Sulfa antibiotics  Home Medications   Prior to Admission medications   Medication Sig Start Date End Date Taking? Authorizing Provider  azithromycin (ZITHROMAX) 250 MG tablet Use as a pack.  Please deliver to patient. 11/10/14   Gwenlyn Found Copland, MD  buPROPion (WELLBUTRIN XL) 300 MG 24 hr tablet Take 300 mg by mouth daily.      Historical Provider, MD  FLUoxetine (PROZAC) 20 MG tablet Take 20 mg by mouth daily.      Historical Provider, MD  furosemide (LASIX) 40 MG tablet Take 40 mg by mouth daily as needed. For swelling     Historical Provider, MD  levothyroxine (SYNTHROID, LEVOTHROID) 125 MCG tablet Take 1 tablet (125 mcg total) by mouth  daily. On empty stomach 05/01/12   Vassie Lollarlos Madera, MD  loratadine (CLARITIN) 10 MG tablet Take 1 tablet (10 mg total) by mouth daily. 05/01/12 05/01/13  Vassie Lollarlos Madera, MD  Multiple Vitamins-Minerals (MULTIVITAMINS THER. W/MINERALS) TABS Take 1 tablet by mouth daily.      Historical Provider, MD  Multiple Vitamins-Minerals (OCUVITE PRESERVISION PO) Take 1 capsule by mouth 2 (two) times daily.      Historical Provider, MD  pantoprazole (PROTONIX) 40 MG tablet Take 1 tablet (40 mg total) by mouth daily at 12 noon. 05/01/12 05/01/13  Vassie Lollarlos Madera, MD  promethazine (PHENERGAN) 12.5 MG tablet Take 1 tablet (12.5 mg total) by mouth every 6  (six) hours as needed for nausea. 05/01/12 05/08/12  Vassie Lollarlos Madera, MD  rosuvastatin (CRESTOR) 20 MG tablet Take 20 mg by mouth daily.      Historical Provider, MD  sodium chloride (OCEAN) 0.65 % SOLN nasal spray Place 2 sprays into the nose every 12 (twelve) hours. Patient not taking: Reported on 11/07/2014 05/01/12   Vassie Lollarlos Madera, MD  spironolactone (ALDACTONE) 25 MG tablet Take 25 mg by mouth 2 (two) times daily as needed. For leg edema     Historical Provider, MD  traMADol (ULTRAM) 50 MG tablet Take 1 tablet (50 mg total) by mouth every 8 (eight) hours as needed (, not relieved by tylenol). For pain. Maximum dose= 8 tablets per day Patient not taking: Reported on 11/07/2014 05/01/12   Vassie Lollarlos Madera, MD  zolpidem (AMBIEN) 5 MG tablet Take 5 mg by mouth at bedtime.      Historical Provider, MD   There were no vitals taken for this visit. Physical Exam  Constitutional: She appears well-developed and well-nourished. No distress.  HENT:  Head: Atraumatic.  Eyes: Conjunctivae are normal.  Neck: Neck supple.  Neurological: She is alert.  Neurologic exam:  Speech clear, pupils equal round reactive to light, extraocular movements intact  Normal peripheral visual fields Cranial nerves III through XII normal including no facial droop Follows commands, moves all extremities x4, normal strength to bilateral upper and lower extremities at all major muscle groups including grip Sensation normal to light touch and pinprick Coordination intact, no limb ataxia, finger-nose-finger normal Rapid alternating movements normal No pronator drift Gait normal   Skin: No rash noted.  Psychiatric: She has a normal mood and affect.  Patient is calm and cooperative, is wavy and appears to be in no acute distress. She is alert to self, place, but cannot remember the year or the current president. She report "I don't keep up with politics".  Nursing note and vitals reviewed.   ED Course  Procedures (including  critical care time)  Elderly female with history of depression sent to the ER against her will to be evaluated for SI and dizziness.  She denies dizziness, only report feeling lightheadedness on occasion when she change position rapidly.  Admits she didn't eat breakfast and may have missed her dinner last night.  She is not actively SI/HI/hallucination.  She is at her baseline.  She has no focal neuro deficits.  Will perform screening exam and consult TTS.  Suspect pt likely stable for discharge once medically cleared.    9:34 PM Care discussed with Dr. Lynelle DoctorKnapp and also with TTS.  TTS has evaluated pt and felt she's stable for discharge.  Pt is medically cleared and stable for discharge.  At the time of discharge UA has not resulted.  Pt denies any dysuria or abdominal lpain.    Labs  Review Labs Reviewed  CBC WITH DIFFERENTIAL/PLATELET - Abnormal; Notable for the following:    Monocytes Absolute 1.1 (*)    All other components within normal limits  COMPREHENSIVE METABOLIC PANEL - Abnormal; Notable for the following:    Potassium 3.0 (*)    Glucose, Bld 103 (*)    GFR calc non Af Amer 65 (*)    GFR calc Af Amer 75 (*)    All other components within normal limits  URINE RAPID DRUG SCREEN (HOSP PERFORMED)  ETHANOL  URINALYSIS, ROUTINE W REFLEX MICROSCOPIC    Imaging Review No results found.   EKG Interpretation None      MDM   Final diagnoses:  Depression    BP 104/77 mmHg  Pulse 76  Resp 16  SpO2 100%     Fayrene Helper, PA-C 11/18/14 2136  Linwood Dibbles, MD 11/18/14 2158

## 2014-11-18 NOTE — Discharge Instructions (Signed)

## 2014-11-18 NOTE — ED Notes (Addendum)
[facility] expressing suicidal ideation. Denied this to EMS. Pt also c/o nausea and dizziness. Recently finished antibiotics for URI. Alert and oriented. APS is involved with patient as she has hx of dementia and does not have any family.

## 2014-11-18 NOTE — ED Notes (Signed)
Bed: WA03 Expected date:  Expected time:  Means of arrival:  Comments: Ems- weakness elderly

## 2015-07-23 ENCOUNTER — Encounter (HOSPITAL_COMMUNITY): Payer: Self-pay | Admitting: Emergency Medicine

## 2015-07-23 ENCOUNTER — Emergency Department (HOSPITAL_COMMUNITY): Payer: Commercial Managed Care - HMO

## 2015-07-23 ENCOUNTER — Emergency Department (HOSPITAL_COMMUNITY)
Admission: EM | Admit: 2015-07-23 | Discharge: 2015-07-24 | Disposition: A | Discharge status: Home / Self Care | Payer: Commercial Managed Care - HMO | Attending: Emergency Medicine | Admitting: Emergency Medicine

## 2015-07-23 DIAGNOSIS — Z880 Allergy status to penicillin: Secondary | ICD-10-CM | POA: Insufficient documentation

## 2015-07-23 DIAGNOSIS — Z8673 Personal history of transient ischemic attack (TIA), and cerebral infarction without residual deficits: Secondary | ICD-10-CM | POA: Diagnosis not present

## 2015-07-23 DIAGNOSIS — Z79899 Other long term (current) drug therapy: Secondary | ICD-10-CM | POA: Insufficient documentation

## 2015-07-23 DIAGNOSIS — F0391 Unspecified dementia with behavioral disturbance: Secondary | ICD-10-CM | POA: Diagnosis not present

## 2015-07-23 DIAGNOSIS — Z87891 Personal history of nicotine dependence: Secondary | ICD-10-CM | POA: Insufficient documentation

## 2015-07-23 DIAGNOSIS — Z8679 Personal history of other diseases of the circulatory system: Secondary | ICD-10-CM | POA: Diagnosis not present

## 2015-07-23 DIAGNOSIS — R911 Solitary pulmonary nodule: Secondary | ICD-10-CM | POA: Diagnosis not present

## 2015-07-23 DIAGNOSIS — E079 Disorder of thyroid, unspecified: Secondary | ICD-10-CM | POA: Diagnosis not present

## 2015-07-23 DIAGNOSIS — Z8739 Personal history of other diseases of the musculoskeletal system and connective tissue: Secondary | ICD-10-CM | POA: Diagnosis not present

## 2015-07-23 DIAGNOSIS — Z792 Long term (current) use of antibiotics: Secondary | ICD-10-CM | POA: Insufficient documentation

## 2015-07-23 DIAGNOSIS — R079 Chest pain, unspecified: Secondary | ICD-10-CM | POA: Diagnosis present

## 2015-07-23 DIAGNOSIS — Z8669 Personal history of other diseases of the nervous system and sense organs: Secondary | ICD-10-CM | POA: Diagnosis not present

## 2015-07-23 HISTORY — DX: Unspecified dementia, unspecified severity, with other behavioral disturbance: F03.918

## 2015-07-23 HISTORY — DX: Unspecified atherosclerosis: I70.90

## 2015-07-23 HISTORY — DX: Unspecified dementia with behavioral disturbance: F03.91

## 2015-07-23 LAB — CBC WITH DIFFERENTIAL/PLATELET
BASOS ABS: 0 10*3/uL (ref 0.0–0.1)
BASOS PCT: 0 %
EOS PCT: 3 %
Eosinophils Absolute: 0.2 10*3/uL (ref 0.0–0.7)
HCT: 36.2 % (ref 36.0–46.0)
Hemoglobin: 11.9 g/dL — ABNORMAL LOW (ref 12.0–15.0)
Lymphocytes Relative: 41 %
Lymphs Abs: 2.8 10*3/uL (ref 0.7–4.0)
MCH: 31.5 pg (ref 26.0–34.0)
MCHC: 32.9 g/dL (ref 30.0–36.0)
MCV: 95.8 fL (ref 78.0–100.0)
MONO ABS: 0.6 10*3/uL (ref 0.1–1.0)
Monocytes Relative: 9 %
Neutro Abs: 3.2 10*3/uL (ref 1.7–7.7)
Neutrophils Relative %: 47 %
PLATELETS: 189 10*3/uL (ref 150–400)
RBC: 3.78 MIL/uL — ABNORMAL LOW (ref 3.87–5.11)
RDW: 13.8 % (ref 11.5–15.5)
WBC: 6.8 10*3/uL (ref 4.0–10.5)

## 2015-07-23 LAB — D-DIMER, QUANTITATIVE (NOT AT ARMC): D DIMER QUANT: 1.21 ug{FEU}/mL — AB (ref 0.00–0.48)

## 2015-07-23 LAB — I-STAT CHEM 8, ED
BUN: 24 mg/dL — ABNORMAL HIGH (ref 6–20)
CALCIUM ION: 1.26 mmol/L (ref 1.13–1.30)
CREATININE: 0.7 mg/dL (ref 0.44–1.00)
Chloride: 106 mmol/L (ref 101–111)
GLUCOSE: 96 mg/dL (ref 65–99)
HEMATOCRIT: 38 % (ref 36.0–46.0)
HEMOGLOBIN: 12.9 g/dL (ref 12.0–15.0)
Potassium: 3.6 mmol/L (ref 3.5–5.1)
Sodium: 143 mmol/L (ref 135–145)
TCO2: 24 mmol/L (ref 0–100)

## 2015-07-23 LAB — I-STAT TROPONIN, ED: Troponin i, poc: 0 ng/mL (ref 0.00–0.08)

## 2015-07-23 NOTE — ED Notes (Signed)
Due to confusion bed alarm placed on stretcher.  Fall risk band applied.

## 2015-07-23 NOTE — ED Notes (Signed)
[facility]  Per staff there patient has been tearful and inconsolable for the last 3 days.  Being treated with xanax 0.25mg  with no relief noted.  Tearful during triage.  Hx of Dementia w/personality disorder.

## 2015-07-23 NOTE — ED Provider Notes (Signed)
CSN: [id number]     Arrival date & time 07/23/15  1942 History   First MD Initiated Contact with Patient 07/23/15 2008     Chief Complaint  Patient presents with  . Anxiety   Chief complaint chest pain  (Consider location/radiation/quality/duration/timing/severity/associated sxs/prior Treatment) HPI Level V caveat dementia. History is obtained from patient and from Lisabeth Pick, med tech at assisted living facility. Patient complained of chest pain at 7 PM to one of the med techs at [facility] house. She presently complains of mild anterior chest pain nonradiating, worse with deep inspiration. Ms. Christell Constant reports the patient has been tearful for several days. Patient reports that she's been tearful over her close friends moving away 2 weeks ago. Treatment prior to coming here. Past Medical History  Diagnosis Date  . Elevated cholesterol   . Allergy   . Anxiety   . Arthritis   . Cataract   . Depression   . Stroke (HCC)   . Thyroid disease    Past Surgical History  Procedure Laterality Date  . Abdominal hysterectomy    . Knee surgery    . Artery biopsy  04/30/2012    Procedure: BIOPSY TEMPORAL ARTERY;  Surgeon: Velora Heckler, MD;  Location: [facility];  Service: General;  Laterality: Left;  Marland Kitchen Eye surgery    . Joint replacement     No family history on file. Social History  Substance Use Topics  . Smoking status: Former Smoker -- 0.50 packs/day for 30 years    Types: Cigarettes  . Smokeless tobacco: Never Used  . Alcohol Use: 0.6 oz/week    1 Glasses of wine per week   OB History    No data available     Review of Systems  Unable to perform ROS: Dementia  Cardiovascular: Positive for chest pain.  Psychiatric/Behavioral: Positive for dysphoric mood.      Allergies  Penicillins and Sulfa antibiotics  Home Medications   Prior to Admission medications   Medication Sig Start Date End Date Taking? Authorizing Provider  azithromycin (ZITHROMAX) 250 MG tablet Use as a pack.   Please deliver to patient. 11/10/14   Gwenlyn Found Copland, MD  levothyroxine (SYNTHROID, LEVOTHROID) 125 MCG tablet Take 1 tablet (125 mcg total) by mouth daily. On empty stomach 05/01/12   Vassie Loll, MD  loratadine (CLARITIN) 10 MG tablet Take 1 tablet (10 mg total) by mouth daily. 05/01/12 05/01/13  Vassie Loll, MD  Multiple Vitamins-Minerals (MULTIVITAMINS THER. W/MINERALS) TABS Take 1 tablet by mouth daily.      Historical Provider, MD  pantoprazole (PROTONIX) 40 MG tablet Take 1 tablet (40 mg total) by mouth daily at 12 noon. 05/01/12 05/01/13  Vassie Loll, MD  promethazine (PHENERGAN) 12.5 MG tablet Take 1 tablet (12.5 mg total) by mouth every 6 (six) hours as needed for nausea. 05/01/12 05/08/12  Vassie Loll, MD  sodium chloride (OCEAN) 0.65 % SOLN nasal spray Place 2 sprays into the nose every 12 (twelve) hours. Patient not taking: Reported on 11/07/2014 05/01/12   Vassie Loll, MD  traMADol (ULTRAM) 50 MG tablet Take 1 tablet (50 mg total) by mouth every 8 (eight) hours as needed (, not relieved by tylenol). For pain. Maximum dose= 8 tablets per day Patient not taking: Reported on 11/07/2014 05/01/12   Vassie Loll, MD  zolpidem (AMBIEN) 5 MG tablet Take 5 mg by mouth at bedtime.      Historical Provider, MD   BP 116/49 mmHg  Pulse 77  Temp(Src) 98 F (  36.7 C)  Resp 16  Ht 5\' 4"  (1.626 m)  Wt 165 lb (74.844 kg)  BMI 28.31 kg/m2  SpO2 96% Physical Exam  Constitutional: She appears well-developed and well-nourished.   tearful  HENT:  Head: Normocephalic and atraumatic.  Eyes: Conjunctivae are normal. Pupils are equal, round, and reactive to light.  Neck: Neck supple. No tracheal deviation present. No thyromegaly present.  Cardiovascular: Normal rate and regular rhythm.   No murmur heard. Pulmonary/Chest: Effort normal and breath sounds normal.  Abdominal: Soft. Bowel sounds are normal. She exhibits no distension. There is no tenderness.  Musculoskeletal: Normal range of motion.  She exhibits no edema or tenderness.  Neurological: She is alert. Coordination normal.  Skin: Skin is warm and dry. No rash noted.  Psychiatric: She has a normal mood and affect.  Nursing note and vitals reviewed.   ED Course  Procedures (including critical care time) Labs Review Labs Reviewed - No data to display  Imaging Review No results found. I have personally reviewed and evaluated these images and lab results as part of my medical decision-making.   EKG Interpretation   Date/Time:  Thursday July 23 2015 21:01:56 EST Ventricular Rate:  67 PR Interval:  216 QRS Duration: 108 QT Interval:  419 QTC Calculation: 442 R Axis:   3 Text Interpretation:  Sinus rhythm Borderline prolonged PR interval Low  voltage, precordial leads Minimal ST depression, inferior leads Baseline  wander in lead(s) II III aVR aVF No significant change since last tracing  Confirmed by Ethelda ChickJACUBOWITZ  MD, Cainen Burnham [phone] on 07/23/2015 9:11:56 PM     chest xray viewed by me. Pt signed out to Dr. Patria Maneampos at 1230 am Results for orders placed or performed during the hospital encounter of 07/23/15  D-dimer, quantitative (not at [facility])  Result Value Ref Range   D-Dimer, Quant 1.21 (H) 0.00 - 0.48 ug/mL-FEU  CBC with Differential/Platelet  Result Value Ref Range   WBC 6.8 4.0 - 10.5 K/uL   RBC 3.78 (L) 3.87 - 5.11 MIL/uL   Hemoglobin 11.9 (L) 12.0 - 15.0 g/dL   HCT 96.236.2 95.236.0 - 84.146.0 %   MCV 95.8 78.0 - 100.0 fL   MCH 31.5 26.0 - 34.0 pg   MCHC 32.9 30.0 - 36.0 g/dL   RDW 32.413.8 40.111.5 - 02.715.5 %   Platelets 189 150 - 400 K/uL   Neutrophils Relative % 47 %   Neutro Abs 3.2 1.7 - 7.7 K/uL   Lymphocytes Relative 41 %   Lymphs Abs 2.8 0.7 - 4.0 K/uL   Monocytes Relative 9 %   Monocytes Absolute 0.6 0.1 - 1.0 K/uL   Eosinophils Relative 3 %   Eosinophils Absolute 0.2 0.0 - 0.7 K/uL   Basophils Relative 0 %   Basophils Absolute 0.0 0.0 - 0.1 K/uL  I-stat troponin, ED  Result Value Ref Range   Troponin i, poc  0.00 0.00 - 0.08 ng/mL   Comment 3          I-stat chem 8, ed  Result Value Ref Range   Sodium 143 135 - 145 mmol/L   Potassium 3.6 3.5 - 5.1 mmol/L   Chloride 106 101 - 111 mmol/L   BUN 24 (H) 6 - 20 mg/dL   Creatinine, Ser 2.530.70 0.44 - 1.00 mg/dL   Glucose, Bld 96 65 - 99 mg/dL   Calcium, Ion 6.641.26 4.031.13 - 1.30 mmol/L   TCO2 24 0 - 100 mmol/L   Hemoglobin 12.9 12.0 - 15.0 g/dL  HCT 38.0 36.0 - 46.0 %  I-stat troponin, ED  Result Value Ref Range   Troponin i, poc 0.01 0.00 - 0.08 ng/mL   Comment 3           Dg Chest Port 1 View  07/23/2015  CLINICAL DATA:  Chest pain today. EXAM: PORTABLE CHEST 1 VIEW COMPARISON:  Frontal and lateral views 11/07/2014 FINDINGS: Lower lung volumes from prior exam. The cardiomediastinal contours are unchanged allowing for differences in technique, heart at the upper limits of normal in size. Increased conspicuity of bronchovascular structures likely related to low lung volumes. Previous opacity in the right lower lung zone has resolved. No confluent airspace disease. No large pleural effusion or pneumothorax. IMPRESSION: Hypoventilatory chest without definite acute process. Electronically Signed   By: Rubye Oaks M.D.   On: 07/23/2015 20:52    MDM  Elevated ddimer low pretest probability for pulmonary embolism. Doubt acute coronary syndrome. Negative troponins Final diagnoses:  None   Diagnosis chest pain     Doug Sou, MD 07/24/15 1610

## 2015-07-24 ENCOUNTER — Encounter (HOSPITAL_COMMUNITY): Payer: Self-pay

## 2015-07-24 ENCOUNTER — Emergency Department (HOSPITAL_COMMUNITY): Payer: Commercial Managed Care - HMO

## 2015-07-24 LAB — I-STAT TROPONIN, ED: TROPONIN I, POC: 0.01 ng/mL (ref 0.00–0.08)

## 2015-07-24 MED ORDER — IOHEXOL 350 MG/ML SOLN
80.0000 mL | Freq: Once | INTRAVENOUS | Status: AC | PRN
  Administered 2015-07-24: 80 mL via INTRAVENOUS

## 2015-07-24 NOTE — ED Provider Notes (Signed)
2:11 AM CT scan negative for PE. Pulmonary nodule noted. Pt informed. Need for 6 month follow up CT scan described. pcp follow up. Doubt ACS   EKG Interpretation  Date/Time:  Thursday July 23 2015 21:01:56 EST Ventricular Rate:  67 PR Interval:  216 QRS Duration: 108 QT Interval:  419 QTC Calculation: 442 R Axis:   3 Text Interpretation:  Sinus rhythm Borderline prolonged PR interval Low voltage, precordial leads Minimal ST depression, inferior leads Baseline wander in lead(s) II III aVR aVF No significant change since last tracing Confirmed by Ethelda ChickJACUBOWITZ  MD, SAM [phone] on 07/23/2015 9:11:56 PM         Azalia BilisKevin Masaru Chamberlin, MD 07/24/15 [phone]

## 2015-07-24 NOTE — ED Notes (Signed)
Pt left with all her belongings and [facility].

## 2015-07-24 NOTE — ED Notes (Signed)
[facility]

## 2015-07-24 NOTE — Discharge Instructions (Signed)
You will need a follow up CT scan of your chest in 6 months to reevaluate the size of the lung nodules we discussed  Nonspecific Chest Pain  Chest pain can be caused by many different conditions. There is always a chance that your pain could be related to something serious, such as a heart attack or a blood clot in your lungs. Chest pain can also be caused by conditions that are not life-threatening. If you have chest pain, it is very important to follow up with your health care provider. CAUSES  Chest pain can be caused by:  Heartburn.  Pneumonia or bronchitis.  Anxiety or stress.  Inflammation around your heart (pericarditis) or lung (pleuritis or pleurisy).  A blood clot in your lung.  A collapsed lung (pneumothorax). It can develop suddenly on its own (spontaneous pneumothorax) or from trauma to the chest.  Shingles infection (varicella-zoster virus).  Heart attack.  Damage to the bones, muscles, and cartilage that make up your chest wall. This can include:  Bruised bones due to injury.  Strained muscles or cartilage due to frequent or repeated coughing or overwork.  Fracture to one or more ribs.  Sore cartilage due to inflammation (costochondritis). RISK FACTORS  Risk factors for chest pain may include:  Activities that increase your risk for trauma or injury to your chest.  Respiratory infections or conditions that cause frequent coughing.  Medical conditions or overeating that can cause heartburn.  Heart disease or family history of heart disease.  Conditions or health behaviors that increase your risk of developing a blood clot.  Having had chicken pox (varicella zoster). SIGNS AND SYMPTOMS Chest pain can feel like:  Burning or tingling on the surface of your chest or deep in your chest.  Crushing, pressure, aching, or squeezing pain.  Dull or sharp pain that is worse when you move, cough, or take a deep breath.  Pain that is also felt in your back,  neck, shoulder, or arm, or pain that spreads to any of these areas. Your chest pain may come and go, or it may stay constant. DIAGNOSIS Lab tests or other studies may be needed to find the cause of your pain. Your health care provider may have you take a test called an ambulatory ECG (electrocardiogram). An ECG records your heartbeat patterns at the time the test is performed. You may also have other tests, such as:  Transthoracic echocardiogram (TTE). During echocardiography, sound waves are used to create a picture of all of the heart structures and to look at how blood flows through your heart.  Transesophageal echocardiogram (TEE).This is a more advanced imaging test that obtains images from inside your body. It allows your health care provider to see your heart in finer detail.  Cardiac monitoring. This allows your health care provider to monitor your heart rate and rhythm in real time.  Holter monitor. This is a portable device that records your heartbeat and can help to diagnose abnormal heartbeats. It allows your health care provider to track your heart activity for several days, if needed.  Stress tests. These can be done through exercise or by taking medicine that makes your heart beat more quickly.  Blood tests.  Imaging tests. TREATMENT  Your treatment depends on what is causing your chest pain. Treatment may include:  Medicines. These may include:  Acid blockers for heartburn.  Anti-inflammatory medicine.  Pain medicine for inflammatory conditions.  Antibiotic medicine, if an infection is present.  Medicines to dissolve blood clots.  Medicines to treat coronary artery disease.  Supportive care for conditions that do not require medicines. This may include:  Resting.  Applying heat or cold packs to injured areas.  Limiting activities until pain decreases. HOME CARE INSTRUCTIONS  If you were prescribed an antibiotic medicine, finish it all even if you start to  feel better.  Avoid any activities that bring on chest pain.  Do not use any tobacco products, including cigarettes, chewing tobacco, or electronic cigarettes. If you need help quitting, ask your health care provider.  Do not drink alcohol.  Take medicines only as directed by your health care provider.  Keep all follow-up visits as directed by your health care provider. This is important. This includes any further testing if your chest pain does not go away.  If heartburn is the cause for your chest pain, you may be told to keep your head raised (elevated) while sleeping. This reduces the chance that acid will go from your stomach into your esophagus.  Make lifestyle changes as directed by your health care provider. These may include:  Getting regular exercise. Ask your health care provider to suggest some activities that are safe for you.  Eating a heart-healthy diet. A registered dietitian can help you to learn healthy eating options.  Maintaining a healthy weight.  Managing diabetes, if necessary.  Reducing stress. SEEK MEDICAL CARE IF:  Your chest pain does not go away after treatment.  You have a rash with blisters on your chest.  You have a fever. SEEK IMMEDIATE MEDICAL CARE IF:   Your chest pain is worse.  You have an increasing cough, or you cough up blood.  You have severe abdominal pain.  You have severe weakness.  You faint.  You have chills.  You have sudden, unexplained chest discomfort.  You have sudden, unexplained discomfort in your arms, back, neck, or jaw.  You have shortness of breath at any time.  You suddenly start to sweat, or your skin gets clammy.  You feel nauseous or you vomit.  You suddenly feel light-headed or dizzy.  Your heart begins to beat quickly, or it feels like it is skipping beats. These symptoms may represent a serious problem that is an emergency. Do not wait to see if the symptoms will go away. Get medical help right  away. Call your local emergency services (911 in the [place] Do not drive yourself to the hospital.   This information is not intended to replace advice given to you by your health care provider. Make sure you discuss any questions you have with your health care provider.   Document Released: 06/08/2005 Document Revised: 09/19/2014 Document Reviewed: 04/04/2014 Elsevier Interactive Patient Education [employer].

## 2015-07-24 NOTE — ED Notes (Signed)
Report given to nursing facility.

## 2017-01-27 ENCOUNTER — Encounter: Payer: Self-pay | Admitting: Vascular Surgery

## 2017-02-08 ENCOUNTER — Encounter: Payer: Medicare HMO | Admitting: Vascular Surgery

## 2017-02-16 ENCOUNTER — Encounter: Payer: Self-pay | Admitting: Vascular Surgery

## 2017-02-22 ENCOUNTER — Ambulatory Visit (INDEPENDENT_AMBULATORY_CARE_PROVIDER_SITE_OTHER): Payer: Medicare HMO | Admitting: Vascular Surgery

## 2017-02-22 ENCOUNTER — Encounter: Payer: Self-pay | Admitting: Vascular Surgery

## 2017-02-22 VITALS — BP 118/72 | HR 88 | Resp 20 | Ht 64.0 in | Wt 142.0 lb

## 2017-02-22 DIAGNOSIS — I70299 Other atherosclerosis of native arteries of extremities, unspecified extremity: Secondary | ICD-10-CM | POA: Diagnosis not present

## 2017-02-22 DIAGNOSIS — L97909 Non-pressure chronic ulcer of unspecified part of unspecified lower leg with unspecified severity: Secondary | ICD-10-CM | POA: Diagnosis not present

## 2017-02-22 NOTE — Progress Notes (Signed)
Patient name: Christina Shepherd MRN: [id number] DOB: 1931-07-15 Sex: female   REASON FOR CONSULT:    Bilateral leg pain. The consult is requested by [employer]  HPI:   Christina Shepherd is a pleasant 83 y.o. female, who presents with wounds on both feet and peripheral vascular disease. I am unable to obtain any meaningful history from the patient as she has severe dementia. However, according to her caregiver she was in much better condition up until January of this year when she developed a fever to 103.8. She was hospitalized in [facility]. Ever since that time she has been not very responsive and has been non-ambulatory. She developed sores in both feet in March. These wounds have gradually improved. She was sent for vascular consultation. There is no family in [place].  Again I am unable to obtain any history from her. According to her caregiver she does complain of generalized pain that seems to be aggravated by moving her legs.  Past Medical History:  Diagnosis Date  . Allergy   . Anxiety   . Arthritis   . Atherosclerosis   . Cataract   . Dementia with behavioral disturbance   . Depression   . Elevated cholesterol   . Stroke (HCC)   . Thyroid disease     No family history on file.  SOCIAL HISTORY: Social History   Social History  . Marital status: Widowed    Spouse name: N/A  . Number of children: N/A  . Years of education: N/A   Occupational History  . retired    Social History Main Topics  . Smoking status: Former Smoker    Packs/day: 0.50    Years: 30.00    Types: Cigarettes  . Smokeless tobacco: Never Used  . Alcohol use 0.6 oz/week    1 Glasses of wine per week  . Drug use: No  . Sexual activity: Not Currently   Other Topics Concern  . Not on file   Social History Narrative   [facility]    No Known Allergies  Current Outpatient Prescriptions  Medication Sig Dispense Refill  . acetaminophen (TYLENOL) 500  MG tablet Take 500 mg by mouth 3 (three) times daily.    Marland Kitchen ALPRAZolam (XANAX) 0.25 MG tablet Take 0.25 mg by mouth 2 (two) times daily.    . Cholecalciferol (VITAMIN D) 2000 UNITS tablet Take 2,000 Units by mouth daily.    . divalproex (DEPAKOTE SPRINKLE) 125 MG capsule Take 125 mg by mouth 3 (three) times daily.    . haloperidol (HALDOL) 2 MG/ML solution Take 1 mg by mouth every 6 (six) hours as needed for agitation.    Marland Kitchen HYDROcodone-acetaminophen (NORCO/VICODIN) 5-325 MG tablet Take 1 tablet by mouth every 8 (eight) hours as needed for moderate pain.    Marland Kitchen ibuprofen (ADVIL,MOTRIN) 200 MG tablet Take 200 mg by mouth every 6 (six) hours as needed for moderate pain.    Marland Kitchen levothyroxine (SYNTHROID, LEVOTHROID) 125 MCG tablet Take 1 tablet (125 mcg total) by mouth daily. On empty stomach 30 tablet 1  . lisinopril (PRINIVIL,ZESTRIL) 5 MG tablet Take 5 mg by mouth daily.    . Multiple Vitamins-Minerals (MULTIVITAMINS THER. W/MINERALS) TABS Take 1 tablet by mouth daily.      . QUEtiapine (SEROQUEL) 25 MG tablet Take 25 mg by mouth at bedtime.    . rosuvastatin (CRESTOR) 10 MG tablet Take 10 mg by mouth daily.    Marland Kitchen zolpidem (AMBIEN) 5  MG tablet Take 5 mg by mouth at bedtime.       No current facility-administered medications for this visit.     REVIEW OF SYSTEMS: Unable to obtain.  PHYSICAL EXAM:   Vitals:   02/22/17 1501  BP: 118/72  Pulse: 88  Resp: 20  SpO2: 95%  Weight: 142 lb (64.4 kg)  Height: 5\' 4"  (1.626 m)    GENERAL: The patient is a well-nourished female, in no acute distress. The vital signs are documented above. CARDIAC: There is a regular rate and rhythm.  VASCULAR: I do not detect carotid bruits. She has palpable femoral pulses and weakly palpable dorsalis pedis pulses bilaterally. On my Doppler assessment she has a brisk dorsalis pedis signal on the right which is monophasic. She has a biphasic posterior tibial signal on the right. On the left side she has a brisk monophasic  dorsalis pedis signal with a biphasic posterior tibial signal. PULMONARY: There is good air exchange bilaterally without wheezing or rales. ABDOMEN: Soft and non-tender with normal pitched bowel sounds.  MUSCULOSKELETAL: There are no major deformities or cyanosis. NEUROLOGIC: She responds to voice. SKIN: She has a 5 mm wound on the lateral malleolus on the left which is fairly clean. She has a 6 mm wound on her right heel which is also fairly clean.   DATA:    ARTERIAL DUPLEX: The patient had a duplex scan done elsewhere on 01/18/2017. This study showed elevated velocities in bilateral common femoral arteries and superficial femoral arteries. There was also bilateral infrapopliteal monophasic waveforms. No specific stenosis was identified bilaterally. ABIs were moderately reduced bilaterally. ABI on the right was 64% and ABI in the left was 58%.  MEDICAL ISSUES:   RIGHT HEEL WOUND AND LEFT LATERAL MALLEOLAR WOUND: Based on my exam I think she has reasonable circulation and should have adequate circulation to heal these wounds. She has biphasic posterior tibial signals bilaterally. Regardless, given her dementia and severely debilitated state she would not be a candidate for any aggressive vascular workup. I've been given instructions to the nursing facility to encourage her to eat as nutrition will be important. We have also discussed the importance of keeping pressure off of her heels and her left lateral malleolus. I'll be happy to see her back at any time if any new vascular issues arise. Waverly Ferrariickson, [facility] [facility] [phone]

## 2017-02-27 ENCOUNTER — Encounter: Payer: Self-pay | Admitting: Internal Medicine

## 2018-11-11 DEATH — deceased
